# Patient Record
Sex: Male | Born: 1960 | Hispanic: No | Marital: Single | Smoking: Current every day smoker
Health system: Southern US, Community
[De-identification: ages and names within clinical notes are randomized; demographics above are authoritative.]

## PROBLEM LIST (undated history)

## (undated) DIAGNOSIS — E119 Type 2 diabetes mellitus without complications: Secondary | ICD-10-CM

## (undated) DIAGNOSIS — I10 Essential (primary) hypertension: Secondary | ICD-10-CM

## (undated) DIAGNOSIS — M67912 Unspecified disorder of synovium and tendon, left shoulder: Secondary | ICD-10-CM

## (undated) DIAGNOSIS — J45909 Unspecified asthma, uncomplicated: Secondary | ICD-10-CM

## (undated) HISTORY — PX: TOTAL HIP ARTHROPLASTY: SHX124

## (undated) HISTORY — PX: TONSILLECTOMY AND ADENOIDECTOMY: SHX28

---

## 2008-02-22 DIAGNOSIS — T513X1A Toxic effect of fusel oil, accidental (unintentional), initial encounter: Secondary | ICD-10-CM | POA: Insufficient documentation

## 2011-08-09 HISTORY — PX: TOTAL HIP ARTHROPLASTY: SHX124

## 2013-05-01 DIAGNOSIS — M1990 Unspecified osteoarthritis, unspecified site: Secondary | ICD-10-CM | POA: Insufficient documentation

## 2016-04-26 DIAGNOSIS — G8929 Other chronic pain: Secondary | ICD-10-CM | POA: Insufficient documentation

## 2018-03-28 ENCOUNTER — Emergency Department (HOSPITAL_COMMUNITY): Payer: Self-pay

## 2018-03-28 ENCOUNTER — Other Ambulatory Visit: Payer: Self-pay

## 2018-03-28 ENCOUNTER — Encounter (HOSPITAL_COMMUNITY): Payer: Self-pay | Admitting: *Deleted

## 2018-03-28 ENCOUNTER — Emergency Department (HOSPITAL_COMMUNITY)
Admission: EM | Admit: 2018-03-28 | Discharge: 2018-03-28 | Disposition: A | Discharge status: Home / Self Care | Payer: Self-pay | Attending: Emergency Medicine | Admitting: Emergency Medicine

## 2018-03-28 DIAGNOSIS — Z96642 Presence of left artificial hip joint: Secondary | ICD-10-CM | POA: Insufficient documentation

## 2018-03-28 DIAGNOSIS — E119 Type 2 diabetes mellitus without complications: Secondary | ICD-10-CM | POA: Insufficient documentation

## 2018-03-28 DIAGNOSIS — Z79899 Other long term (current) drug therapy: Secondary | ICD-10-CM | POA: Insufficient documentation

## 2018-03-28 DIAGNOSIS — M79605 Pain in left leg: Secondary | ICD-10-CM | POA: Insufficient documentation

## 2018-03-28 DIAGNOSIS — Z794 Long term (current) use of insulin: Secondary | ICD-10-CM | POA: Insufficient documentation

## 2018-03-28 DIAGNOSIS — F17210 Nicotine dependence, cigarettes, uncomplicated: Secondary | ICD-10-CM | POA: Insufficient documentation

## 2018-03-28 DIAGNOSIS — I10 Essential (primary) hypertension: Secondary | ICD-10-CM | POA: Insufficient documentation

## 2018-03-28 HISTORY — DX: Unspecified disorder of synovium and tendon, left shoulder: M67.912

## 2018-03-28 HISTORY — DX: Type 2 diabetes mellitus without complications: E11.9

## 2018-03-28 HISTORY — DX: Essential (primary) hypertension: I10

## 2018-03-28 HISTORY — DX: Unspecified asthma, uncomplicated: J45.909

## 2018-03-28 NOTE — ED Provider Notes (Signed)
[facility] EMERGENCY DEPARTMENT Provider Note   CSN: [id number] Arrival date & time: 03/28/18  [id number]     History   Chief Complaint Chief Complaint  Patient presents with  . Leg Pain    HPI Dustin Moore is a 57 y.o. male.  Patient new to the area.  He has had pain kind of left lateral calf area radiates sometimes down to his foot sometimes up to his thigh this been ongoing for 3 days.  No history of injury.  [place].  He uses BenGay and has tried ibuprofen without relief.  Patient does have a history of diabetes.  But not known to have diabetic neuropathy.  Patient denies any back pain.     Past Medical History:  Diagnosis Date  . Asthma   . Diabetes mellitus without complication (HCC)   . Hypertension   . Rotator cuff disorder, left     There are no active problems to display for this patient.   Past Surgical History:  Procedure Laterality Date  . TONSILLECTOMY AND ADENOIDECTOMY    . TOTAL HIP ARTHROPLASTY Left         Home Medications    Prior to Admission medications   Medication Sig Start Date End Date Taking? Authorizing Provider  albuterol (PROVENTIL HFA) 108 (90 Base) MCG/ACT inhaler INHALE 2 PUFFS INTO THE LUNGS EVERY 4 HOURS AS NEEDED FOR SHORTNESS OF BREATH OR WHEEZING. 08/24/17  Yes [provider]  budesonide-formoterol (SYMBICORT) 160-4.5 MCG/ACT inhaler INHALE 2 PUFFS INTO LUNGS 2 TIMES DAILY 03/05/18  Yes [provider]  Insulin Glargine (LANTUS SOLOSTAR) 100 UNIT/ML Solostar Pen Inject into the skin. 01/30/17  Yes [provider]  insulin lispro (HUMALOG KWIKPEN) 100 UNIT/ML KiwkPen INJECT 10 UNITS INTO THE SKIN 3 TIMES DAILY BEFORE MEALS. 12/21/17  Yes [provider]  losartan-hydrochlorothiazide (HYZAAR) 50-12.5 MG tablet Take by mouth. 09/27/17  Yes [provider]    Family History No family history on file.  Social History Social History    Tobacco Use  . Smoking status: Current Every Day Smoker    Packs/day: 0.50    Types: Cigarettes  Substance Use Topics  . Alcohol use: Yes    Comment: occasinally on weekends   . Drug use: Never     Allergies   Contrast media [iodinated diagnostic agents]   Review of Systems Review of Systems  Constitutional: Negative for fever.  HENT: Negative for congestion.   Eyes: Negative for redness.  Respiratory: Positive for shortness of breath.   Gastrointestinal: Negative for abdominal pain.  Genitourinary: Negative for dysuria.  Musculoskeletal: Negative for back pain and joint swelling.  Skin: Negative for wound.  Neurological: Negative for headaches.  Hematological: Does not bruise/bleed easily.  Psychiatric/Behavioral: Negative for confusion.     Physical Exam Updated Vital Signs BP (!) 142/85 (BP Location: Right Arm)   Pulse 71   Temp 98.2 F (36.8 C) (Oral)   Resp 16   Ht 1.791 m (5' 10.5")   Wt 99.8 kg   SpO2 97%   BMI 31.12 kg/m   Physical Exam  Constitutional: He is oriented to person, place, and time. He appears well-developed and well-nourished. No distress.  HENT:  Head: Normocephalic and atraumatic.  Mouth/Throat: Oropharynx is clear and moist.  Eyes: Pupils are equal, round, and reactive to light. Conjunctivae and EOM are normal.  Neck: Normal range of motion. Neck supple.  Cardiovascular: Normal rate, regular  rhythm and normal heart sounds.  Pulmonary/Chest: Effort normal and breath sounds normal. No respiratory distress.  Abdominal: Soft. Bowel sounds are normal. There is no tenderness.  Musculoskeletal: Normal range of motion. He exhibits no edema, tenderness or deformity.  Subjectively pain to the lateral aspect of the left leg.  No swelling no deformity no erythema no tenderness to palpation.  Knee joint ankle joint all normal.  Good cap refill to the left foot sensation intact.  Dorsalis pedis pulse was 1+.  Neurological: He is alert and  oriented to person, place, and time. No cranial nerve deficit or sensory deficit. He exhibits normal muscle tone. Coordination normal.  Skin: Skin is warm.  Nursing note and vitals reviewed.    ED Treatments / Results  Labs (all labs ordered are listed, but only abnormal results are displayed) Labs Reviewed - No data to display  EKG None  Radiology Dg Tibia/fibula Left  Result Date: 03/28/2018 CLINICAL DATA:  Left calf pain EXAM: LEFT TIBIA AND FIBULA - 2 VIEW COMPARISON:  None. FINDINGS: There is no evidence of fracture or other focal bone lesions. Soft tissues are unremarkable. IMPRESSION: Negative. Electronically Signed   By: Charlett NoseKevin  Dover M.D.   On: 03/28/2018 08:55   [place] Venous Img Lower  Left (dvt Study)  Result Date: 03/28/2018 CLINICAL DATA:  Left calf pain. EXAM: LEFT LOWER EXTREMITY VENOUS DOPPLER ULTRASOUND TECHNIQUE: Gray-scale sonography with graded compression, as well as color Doppler and duplex ultrasound were performed to evaluate the lower extremity deep venous systems from the level of the common femoral vein and including the common femoral, femoral, profunda femoral, popliteal and calf veins including the posterior tibial, peroneal and gastrocnemius veins when visible. The superficial great saphenous vein was also interrogated. Spectral Doppler was utilized to evaluate flow at rest and with distal augmentation maneuvers in the common femoral, femoral and popliteal veins. COMPARISON:  None. FINDINGS: Contralateral Common Femoral Vein: Respiratory phasicity is normal and symmetric with the symptomatic side. No evidence of thrombus. Normal compressibility. Common Femoral Vein: No evidence of thrombus. Normal compressibility, respiratory phasicity and response to augmentation. Saphenofemoral Junction: No evidence of thrombus. Normal compressibility and flow on color Doppler imaging. Profunda Femoral Vein: No evidence of thrombus. Normal compressibility and flow on color Doppler  imaging. Femoral Vein: No evidence of thrombus. Normal compressibility, respiratory phasicity and response to augmentation. Popliteal Vein: No evidence of thrombus. Normal compressibility, respiratory phasicity and response to augmentation. Calf Veins: No evidence of thrombus. Normal compressibility and flow on color Doppler imaging. Superficial Great Saphenous Vein: No evidence of thrombus. Normal compressibility. Venous Reflux:  None. Other Findings: No evidence of superficial thrombophlebitis or abnormal fluid collection. IMPRESSION: No evidence of left lower extremity deep venous thrombosis. Electronically Signed   By: Irish LackGlenn  Yamagata M.D.   On: 03/28/2018 10:14    Procedures Procedures (including critical care time)  Medications Ordered in ED Medications - No data to display   Initial Impression / Assessment and Plan / ED Course  I have reviewed the triage vital signs and the nursing notes.  Pertinent labs & imaging results that were available during my care of the patient were reviewed by me and considered in my medical decision making (see chart for details).    Patient's work-up for the left leg pain without any significant findings.  The Doppler studies negative for deep vein thrombosis.  X-rays of the tib-fib of the left leg were negative.  No erythema no swelling pain seems to be lateral aspect  of the of the leg itself.  Knees fine ankles fine no erythema no swelling good dorsalis pedis pulse good cap refill.  No tenderness to palpation.  Patient claimed he was new to the area.  Was given referral to Dr. Mort Sawyers office for orthopedics.  Also was planning on giving a prescription for tramadol to help with the pain which is more so at rest than it is with any exertion.  So not consistent with a claudication type picture.  Patient stated that that would be good enough wanted something stronger.  Based on that also reviewed the database.  Patient had received 30 tablets of oxycodone on  August 15 from the hospital area.  And also received on August 5 another 30 tablets of cocci codon.  This prescription did note on the 15th that it was to last for 30 days.  Based on this patient not given a narcotic prescription for pain.  When confronted patient said okay then just took his discharge and left.  Not clear the cause of the patient's symptoms.  Diabetic neuropathy is possibility but an unusual pattern for that.  Again still did recommend follow-up with orthopedics.  Final Clinical Impressions(s) / ED Diagnoses   Final diagnoses:  Left leg pain    ED Discharge Orders    None       Vanetta Mulders, MD 03/28/18 1555

## 2018-03-28 NOTE — Discharge Instructions (Addendum)
Work-up for the left leg pain with negative Doppler studies for a blood clot in the leg.  X-rays of the bone of the leg without any abnormalities.  Exact cause of the leg pain is not clear.  Could be related to diabetic neuropathy could be just musculoskeletal in nature.  Recommend follow-up with orthopedics.  State database shows that you received narcotic prescription on August 15 from your physician in [place] that was due to be a month supply.  Not able to fill any narcotic pain medicines for you at this time.

## 2018-03-28 NOTE — ED Triage Notes (Signed)
Pt c/o left calf pain with radiation up to the thigh and down lower leg x 3 days. Pt has used Bengay and Ibuprofen without relief. Pt reports the pain is better when he stands up. Denies injury.

## 2018-09-16 ENCOUNTER — Emergency Department (HOSPITAL_COMMUNITY)
Admission: EM | Admit: 2018-09-16 | Discharge: 2018-09-16 | Disposition: A | Discharge status: Home / Self Care | Payer: Self-pay | Attending: Emergency Medicine | Admitting: Emergency Medicine

## 2018-09-16 ENCOUNTER — Other Ambulatory Visit: Payer: Self-pay

## 2018-09-16 ENCOUNTER — Encounter (HOSPITAL_COMMUNITY): Payer: Self-pay | Admitting: Emergency Medicine

## 2018-09-16 DIAGNOSIS — L0291 Cutaneous abscess, unspecified: Secondary | ICD-10-CM

## 2018-09-16 DIAGNOSIS — J45909 Unspecified asthma, uncomplicated: Secondary | ICD-10-CM | POA: Insufficient documentation

## 2018-09-16 DIAGNOSIS — E119 Type 2 diabetes mellitus without complications: Secondary | ICD-10-CM | POA: Insufficient documentation

## 2018-09-16 DIAGNOSIS — Z79899 Other long term (current) drug therapy: Secondary | ICD-10-CM | POA: Insufficient documentation

## 2018-09-16 DIAGNOSIS — F17210 Nicotine dependence, cigarettes, uncomplicated: Secondary | ICD-10-CM | POA: Insufficient documentation

## 2018-09-16 DIAGNOSIS — L02214 Cutaneous abscess of groin: Secondary | ICD-10-CM | POA: Insufficient documentation

## 2018-09-16 DIAGNOSIS — I10 Essential (primary) hypertension: Secondary | ICD-10-CM | POA: Insufficient documentation

## 2018-09-16 DIAGNOSIS — Z7982 Long term (current) use of aspirin: Secondary | ICD-10-CM | POA: Insufficient documentation

## 2018-09-16 DIAGNOSIS — Z794 Long term (current) use of insulin: Secondary | ICD-10-CM | POA: Insufficient documentation

## 2018-09-16 MED ORDER — LIDOCAINE HCL (PF) 1 % IJ SOLN
5.0000 mL | Freq: Once | INTRAMUSCULAR | Status: AC
  Administered 2018-09-16: 5 mL

## 2018-09-16 MED ORDER — LIDOCAINE HCL (PF) 1 % IJ SOLN
INTRAMUSCULAR | Status: AC
  Filled 2018-09-16: qty 6

## 2018-09-16 MED ORDER — SULFAMETHOXAZOLE-TRIMETHOPRIM 800-160 MG PO TABS: 1.0000 | ORAL_TABLET | Freq: Two times a day (BID) | ORAL | 0 refills | Status: AC

## 2018-09-16 NOTE — ED Notes (Signed)
ED Provider at bedside. 

## 2018-09-16 NOTE — ED Provider Notes (Signed)
[facility] EMERGENCY DEPARTMENT Provider Note   CSN: [id number] Arrival date & time: 09/16/18  0202     History   Chief Complaint Chief Complaint  Patient presents with  . Abscess    HPI Dustin Moore is a 58 y.o. male.  Patient is a 58 year old male with past medical history of diabetes and hypertension.  He presents today for evaluation of pain and swelling to the left groin.  He was seen by his doctor 2 days ago for similar complaints.  He was started on doxycycline for presumed cellulitis, however the swelling and pain has worsened.  He denies any fevers or chills.  He does inject himself in the abdomen with his insulin and believes that this may have caused the infection.  The history is provided by the patient.  Abscess  Abscess location: Left groin. Size:  5 cm x 3 cm Abscess quality: fluctuance, induration, painful and redness   Red streaking: no   Duration:  4 days Progression:  Worsening Pain details:    Quality:  Throbbing   Severity:  Moderate   Timing:  Constant   Progression:  Worsening Chronicity:  New Context: diabetes   Relieved by:  Nothing Ineffective treatments: Doxycycline. Associated symptoms: no fever     Past Medical History:  Diagnosis Date  . Asthma   . Diabetes mellitus without complication (HCC)   . Hypertension   . Rotator cuff disorder, left     There are no active problems to display for this patient.   Past Surgical History:  Procedure Laterality Date  . TONSILLECTOMY AND ADENOIDECTOMY    . TOTAL HIP ARTHROPLASTY Left         Home Medications    Prior to Admission medications   Medication Sig Start Date End Date Taking? Authorizing Provider  albuterol (PROVENTIL HFA) 108 (90 Base) MCG/ACT inhaler INHALE 2 PUFFS INTO THE LUNGS EVERY 4 HOURS AS NEEDED FOR SHORTNESS OF BREATH OR WHEEZING. 08/24/17  Yes [provider]  aspirin EC 81 MG tablet Take 81 mg by mouth daily.   Yes [provider]    budesonide-formoterol (SYMBICORT) 160-4.5 MCG/ACT inhaler INHALE 2 PUFFS INTO LUNGS 2 TIMES DAILY 03/05/18  Yes [provider]  doxycycline (VIBRAMYCIN) 100 MG capsule Take 100 mg by mouth 2 (two) times daily.   Yes [provider]  Insulin Glargine (LANTUS SOLOSTAR) 100 UNIT/ML Solostar Pen Inject 45 Units into the skin at bedtime.  01/30/17  Yes [provider]  insulin lispro (HUMALOG KWIKPEN) 100 UNIT/ML KiwkPen INJECT 10 UNITS INTO THE SKIN 3 TIMES DAILY BEFORE MEALS. 12/21/17  Yes [provider]  losartan-hydrochlorothiazide (HYZAAR) 50-12.5 MG tablet Take 1 tablet by mouth daily.  09/27/17  Yes [provider]  MAGNESIUM PO Take 1 tablet by mouth daily.   Yes [provider]    Family History No family history on file.  Social History Social History   Tobacco Use  . Smoking status: Current Every Day Smoker    Packs/day: 0.50    Types: Cigarettes  . Smokeless tobacco: Never Used  Substance Use Topics  . Alcohol use: Yes    Comment: occasinally on weekends   . Drug use: Never     Allergies   Contrast media [iodinated diagnostic agents]   Review of Systems Review of Systems  Constitutional: Negative for fever.  All other systems reviewed and are negative.    Physical Exam Updated Vital Signs BP 137/85 (BP Location: Left Arm)  Pulse 85   Temp 98.6 F (37 C) (Oral)   Resp 20   Ht 5\' 10"  (1.778 m)   Wt 93.4 kg   SpO2 96%   BMI 29.56 kg/m   Physical Exam Vitals signs and nursing note reviewed.  Constitutional:      Appearance: Normal appearance.  HENT:     Head: Normocephalic.  Pulmonary:     Effort: Pulmonary effort is normal.  Skin:    General: Skin is warm and dry.     Comments: There is a 5 cm x 3 cm area of induration in the left upper groin with erythema extending downward towards the genitalia.  There is no significant swelling or discoloration of the genitals.  There is no crepitus in the  scrotum or penis.  Neurological:     Mental Status: He is alert.      ED Treatments / Results  Labs (all labs ordered are listed, but only abnormal results are displayed) Labs Reviewed - No data to display  EKG None  Radiology No results found.  Procedures Procedures (including critical care time)  Medications Ordered in ED Medications  lidocaine (PF) (XYLOCAINE) 1 % injection 5 mL (5 mLs Infiltration Given 09/16/18 0227)     Initial Impression / Assessment and Plan / ED Course  I have reviewed the triage vital signs and the nursing notes.  Pertinent labs & imaging results that were available during my care of the patient were reviewed by me and considered in my medical decision making (see chart for details).  Patient with an abscess in his groin that was incised and drained with a significant quantity of foul-smelling, purulent material.  Patient tolerated this procedure well.  He will be discharged with continued doxycycline, added Bactrim, warm soaks, and follow-up as needed if he worsens.  INCISION AND DRAINAGE Performed by: Geoffery Lyons Consent: Verbal consent obtained. Risks and benefits: risks, benefits and alternatives were discussed Type: abscess  Body area: Left groin  Anesthesia: local infiltration  Incision was made with a scalpel.  Local anesthetic: lidocaine 1 % without epinephrine  Anesthetic total: 5 ml  Complexity: complex Blunt dissection to break up loculations  Drainage: purulent  Drainage amount: Moderate  Packing material: No packing placed  Patient tolerance: Patient tolerated the procedure well with no immediate complications.     Final Clinical Impressions(s) / ED Diagnoses   Final diagnoses:  None    ED Discharge Orders    None       Geoffery Lyons, MD 09/16/18 [phone]

## 2018-09-16 NOTE — Discharge Instructions (Addendum)
Continue doxycycline as previously prescribed and to begin taking Bactrim as prescribed this evening.  Apply warm soaks or sit in the tub several times daily for the next several days.  Return to the emergency department for worsening swelling, high fever, worsening pain, or other new and concerning symptoms.

## 2018-09-16 NOTE — ED Notes (Signed)
Family at bedside. 

## 2018-09-16 NOTE — ED Triage Notes (Signed)
Pt states he is diabetic and gives himself insulin shots in his stomach and gave one lower than usual and noticed a small area "come up". He saw his NP yesterday and was started on ABX but states that tonight the redness and swelling has moved down towards his penis.

## 2021-01-30 ENCOUNTER — Other Ambulatory Visit: Payer: Self-pay

## 2021-01-30 ENCOUNTER — Emergency Department (HOSPITAL_COMMUNITY): Payer: Self-pay

## 2021-01-30 ENCOUNTER — Emergency Department (HOSPITAL_COMMUNITY)
Admission: EM | Admit: 2021-01-30 | Discharge: 2021-01-30 | Disposition: A | Discharge status: Home / Self Care | Payer: Self-pay | Attending: Emergency Medicine | Admitting: Emergency Medicine

## 2021-01-30 ENCOUNTER — Encounter (HOSPITAL_COMMUNITY): Payer: Self-pay | Admitting: *Deleted

## 2021-01-30 DIAGNOSIS — Z96642 Presence of left artificial hip joint: Secondary | ICD-10-CM | POA: Insufficient documentation

## 2021-01-30 DIAGNOSIS — R079 Chest pain, unspecified: Secondary | ICD-10-CM | POA: Insufficient documentation

## 2021-01-30 DIAGNOSIS — I10 Essential (primary) hypertension: Secondary | ICD-10-CM | POA: Insufficient documentation

## 2021-01-30 DIAGNOSIS — Z7951 Long term (current) use of inhaled steroids: Secondary | ICD-10-CM | POA: Insufficient documentation

## 2021-01-30 DIAGNOSIS — E119 Type 2 diabetes mellitus without complications: Secondary | ICD-10-CM | POA: Insufficient documentation

## 2021-01-30 DIAGNOSIS — R0602 Shortness of breath: Secondary | ICD-10-CM | POA: Insufficient documentation

## 2021-01-30 DIAGNOSIS — Z79899 Other long term (current) drug therapy: Secondary | ICD-10-CM | POA: Insufficient documentation

## 2021-01-30 DIAGNOSIS — R109 Unspecified abdominal pain: Secondary | ICD-10-CM | POA: Insufficient documentation

## 2021-01-30 DIAGNOSIS — J45909 Unspecified asthma, uncomplicated: Secondary | ICD-10-CM | POA: Insufficient documentation

## 2021-01-30 DIAGNOSIS — W312XXA Contact with powered woodworking and forming machines, initial encounter: Secondary | ICD-10-CM | POA: Insufficient documentation

## 2021-01-30 DIAGNOSIS — S61211A Laceration without foreign body of left index finger without damage to nail, initial encounter: Secondary | ICD-10-CM | POA: Insufficient documentation

## 2021-01-30 DIAGNOSIS — F17210 Nicotine dependence, cigarettes, uncomplicated: Secondary | ICD-10-CM | POA: Insufficient documentation

## 2021-01-30 DIAGNOSIS — Z794 Long term (current) use of insulin: Secondary | ICD-10-CM | POA: Insufficient documentation

## 2021-01-30 DIAGNOSIS — Z7982 Long term (current) use of aspirin: Secondary | ICD-10-CM | POA: Insufficient documentation

## 2021-01-30 MED ORDER — CEPHALEXIN 500 MG PO CAPS: 500.0000 mg | ORAL_CAPSULE | Freq: Four times a day (QID) | ORAL | 0 refills | Status: AC

## 2021-01-30 MED ORDER — STERILE WATER FOR INJECTION IJ SOLN
INTRAMUSCULAR | Status: AC
  Administered 2021-01-30: 2.5 mL
  Filled 2021-01-30: qty 10

## 2021-01-30 MED ORDER — CEFAZOLIN SODIUM 1 G IJ SOLR
1.0000 g | Freq: Once | INTRAMUSCULAR | Status: AC
  Administered 2021-01-30: 1 g via INTRAMUSCULAR
  Filled 2021-01-30: qty 10

## 2021-01-30 MED ORDER — LIDOCAINE HCL (PF) 1 % IJ SOLN
5.0000 mL | Freq: Once | INTRAMUSCULAR | Status: AC
  Administered 2021-01-30: 5 mL
  Filled 2021-01-30: qty 30

## 2021-01-30 NOTE — Discharge Instructions (Addendum)
You have received 8 sutures in your finger, please keep them dry for the first 24 hours.  After that I would like you to change the dressings and rinse out the wound 2 times daily for the next 7 days.  Starting you on antibiotics please take as prescribed.  Please keep the splint on, you may take off to rinse it off.  Please follow-up with hand surgery for further evaluation.  Come back to the emergency department if you develop chest pain, shortness of breath, severe abdominal pain, uncontrolled nausea, vomiting, diarrhea.

## 2021-01-30 NOTE — ED Triage Notes (Signed)
Laceration to left index finger , cut with a saw

## 2021-01-30 NOTE — ED Provider Notes (Signed)
[facility]ENCY [facility]PART[facility] Provider Note   CSN: [id number] Arrival date & time: 01/30/21  1548     History Chief Complaint  Patient presents with   Extremity Laceration    Dustin Moore is a 60 y.o. male.  HPI  Patient with significant medical history of asthma, diabetes, hypertension, presents with chief complaint of laceration to his left index finger.  Patient states today he was using a saw and cut the distal end of his finger, states he had pain in that finger, was able to control the bleeding with direct pressure.  States it feels numb, but is able to move at all joints of his index finger.  He is up-to-date on his tetanus shot, is not on  immunosuppressants medications.  He has no other complaints this time.  Patient does endorse chest pain, shortness breath, abdominal pain.  Past Medical History:  Diagnosis Date   Asthma    Diabetes mellitus without complication (HCC)    Hypertension    Rotator cuff disorder, left     There are no problems to display for this patient.   Past Surgical History:  Procedure Laterality Date   TONSILLECTOMY AND ADENOIDECTOMY     TOTAL HIP ARTHROPLASTY Left        No family history on file.  Social History   Tobacco Use   Smoking status: Every Day    Packs/day: 0.50    Pack years: 0.00    Types: Cigarettes   Smokeless tobacco: Never  Substance Use Topics   Alcohol use: Yes    Comment: occasinally on weekends    Drug use: Never    Home Medications Prior to Admission medications   Medication Sig Start Date End Date Taking? Authorizing Provider  cephALEXin (KEFLEX) 500 MG capsule Take 1 capsule (500 mg total) by mouth 4 (four) times daily for 7 days. 01/30/21 02/06/21 Yes Carroll SageFaulkner, Ena Demary J, PA-C  albuterol (PROVENTIL HFA) 108 (90 Base) MCG/ACT inhaler INHALE 2 PUFFS INTO THE LUNGS EVERY 4 HOURS AS NEEDED FOR SHORTNESS OF BREATH OR WHEEZING. 08/24/17   [provider]  aspirin EC 81 MG tablet Take 81 mg by mouth  daily.    [provider]  budesonide-formoterol (SYMBICORT) 160-4.5 MCG/ACT inhaler INHALE 2 PUFFS INTO LUNGS 2 TIMES DAILY 03/05/18   [provider]  doxycycline (VIBRAMYCIN) 100 MG capsule Take 100 mg by mouth 2 (two) times daily.    [provider]  Insulin Glargine (LANTUS SOLOSTAR) 100 UNIT/ML Solostar Pen Inject 45 Units into the skin at bedtime.  01/30/17   [provider]  insulin lispro (HUMALOG KWIKPEN) 100 UNIT/ML KiwkPen INJECT 10 UNITS INTO THE SKIN 3 TIMES DAILY BEFORE MEALS. 12/21/17   [provider]  losartan-hydrochlorothiazide (HYZAAR) 50-12.5 MG tablet Take 1 tablet by mouth daily.  09/27/17   [provider]  MAGNESIUM PO Take 1 tablet by mouth daily.    [provider]    Allergies    Contrast media [iodinated diagnostic agents]  Review of Systems   Review of Systems  Constitutional:  Negative for chills and fever.  HENT:  Negative for congestion.   Respiratory:  Negative for shortness of breath.   Cardiovascular:  Negative for chest pain.  Gastrointestinal:  Negative for abdominal pain.  Genitourinary:  Negative for enuresis.  Musculoskeletal:  Negative for back pain.  Skin:  Positive for wound. Negative for rash.       Wound to the left index finger.  Neurological:  Negative for  dizziness.  Hematological:  Does not bruise/bleed easily.   Physical Exam Updated Vital Signs BP (!) 134/96 (BP Location: Right Arm)   Pulse 86   Temp 98 F (36.7 C) (Oral)   Resp 18   Ht 5\' 10"  (1.778 m)   Wt 92.1 kg   SpO2 97%   BMI 29.15 kg/m   Physical Exam Vitals and nursing note reviewed.  Constitutional:      General: He is not in acute distress.    Appearance: He is not ill-appearing.  HENT:     Head: Normocephalic and atraumatic.     Nose: No congestion.  Eyes:     Conjunctiva/sclera: Conjunctivae normal.  Cardiovascular:     Rate and Rhythm: Normal rate and regular rhythm.     Pulses: Normal  pulses.  Pulmonary:     Effort: Pulmonary effort is normal.  Musculoskeletal:     Comments: Patient has a noted laceration on the distal end of his left index finger, distal to the PIP joint, there is no noted foreign body present, no ligament or tendon damage noted, he has full range of motion at all joints his finger, neurovascular fully intact.  Skin:    General: Skin is warm and dry.  Neurological:     Mental Status: He is alert.  Psychiatric:        Mood and Affect: Mood normal.       ED Results / Procedures / Treatments   Labs (all labs ordered are listed, but only abnormal results are displayed) Labs Reviewed - No data to display  EKG None  Radiology DG Finger Index Left  Result Date: 01/30/2021 CLINICAL DATA:  Laceration with circular Sol. EXAM: LEFT INDEX FINGER 2+V COMPARISON:  None. FINDINGS: There appears to be a relatively subtle fracture underlying the laceration in the distal tuft of the index finger. Evaluation for foreign bodies is limited due to the overlapping gauze. No definite foreign bodies are identified. The laceration is noted. IMPRESSION: Evaluation for foreign bodies is limited due to overlying gauze but no foreign body is noted. Fracture through the tuft of the index finger underlying laceration. Electronically Signed   By: 02/01/2021 III M.D   On: 01/30/2021 17:17    Procedures .06/27/2022Laceration Repair  Date/Time: 01/30/2021 6:14 PM Performed by: 02/01/2021, PA-C Authorized by: Carroll Sage, PA-C   Consent:    Consent obtained:  Verbal   Consent given by:  Patient   Risks discussed:  Infection, pain, retained foreign body, need for additional repair, poor cosmetic result, tendon damage, vascular damage, poor wound healing and nerve damage   Alternatives discussed:  No treatment Universal protocol:    Patient identity confirmed:  Verbally with patient Anesthesia:    Anesthesia method:  Nerve block   Block needle gauge:  24 G    Block anesthetic:  Lidocaine 1% w/o epi   Block outcome:  Anesthesia achieved Laceration details:    Location:  Finger   Finger location:  L index finger   Length (cm):  6   Depth (mm):  2 Pre-procedure details:    Preparation:  Patient was prepped and draped in usual sterile fashion and imaging obtained to evaluate for foreign bodies Exploration:    Hemostasis achieved with:  Direct pressure   Imaging obtained: x-ray     Imaging outcome: foreign body not noted     Wound exploration: wound explored through full range of motion and entire depth of wound visualized  Contaminated: no   Treatment:    Area cleansed with:  Saline   Amount of cleaning:  Extensive   Irrigation solution:  Sterile saline   Irrigation method:  Syringe   Visualized foreign bodies/material removed: no     Debridement:  None Skin repair:    Repair method:  Sutures   Suture size:  4-0   Suture material:  Prolene   Suture technique:  Simple interrupted   Number of sutures:  8 Approximation:    Approximation:  Close Repair type:    Repair type:  Simple Post-procedure details:    Dressing:  Non-adherent dressing and splint for protection   Procedure completion:  Tolerated well, no immediate complications Comments:     After the procedure patient motor, sensation, strength were all intact. area was soft to the touch with good capillary refill.  No signs of infection were noted, no rash, no ligament or tendon damage present.   Medications Ordered in ED Medications  lidocaine (PF) (XYLOCAINE) 1 % injection 5 mL (5 mLs Infiltration Given 01/30/21 1803)  ceFAZolin (ANCEF) injection 1 g (1 g Intramuscular Given 01/30/21 1803)  sterile water (preservative free) injection (2.5 mLs  Given 01/30/21 1803)    ED Course  I have reviewed the triage vital signs and the nursing notes.  Pertinent labs & imaging results that were available during my care of the patient were reviewed by me and considered in my medical  decision making (see chart for details).    MDM Rules/Calculators/A&P                         Initial impression-patient presents with laceration to his left index finger.  He is alert, does not appear in acute stress, vital signs reassuring.  Will obtain imaging for further evaluation, will recommend sutures for improved wound healing.  Work-up-x-ray reveals no foreign body present, fracture to the tuft of the index finger.  Reassessment-Will recommend suturing to decrease infection risk and to assist with the healing process went over risks and benefits.  Patient was agreeable with this and tolerated the procedure well.  Unfortunately on the lateral aspect of the laceration there is chunk of flesh missing, unable to close with suturing, this will have to heal second tension, patient is aware of scarring process.  He received 8 sutures.  Neurovascular was fully intact after the procedure  Rule out-Low suspicion for fracture or dislocation as x-ray does not reveal any acute findings. low suspicion for ligament or tendon damage as area was palpated no gross defects noted, he had full range of motion in his left index finger.  Low suspicion for compartment syndrome as area was palpated it was soft to the touch, neurovascular fully intact before and after the procedure  Plan-  Laceration of the left index finger-patient received 8 sutures, received IV Ancef in the emergency department, will go home on Keflex, a hard splint due to tuft fracture follow-up with hand surgery for further evaluation.  Vital signs have remained stable, no indication for hospital admission.Patient given at home care as well strict return precautions.  Patient verbalized that they understood agreed to said plan.  Final Clinical Impression(s) / ED Diagnoses Final diagnoses:  Laceration of left index finger without foreign body without damage to nail, initial encounter    Rx / DC Orders ED Discharge Orders           Ordered    cephALEXin (KEFLEX) 500 MG capsule  4 times daily        01/30/21 1807             Barnie Del 01/30/21 1815    Derwood Kaplan, MD 01/31/21 [phone]

## 2021-02-22 ENCOUNTER — Telehealth: Payer: Self-pay

## 2021-02-22 NOTE — Telephone Encounter (Signed)
Client recently enrolled in [facility] and provider is [facility]. Client has been approved for [place] [facility] until 02/05/22 however Toujeo and Symbicort are not available and cost is a burden without insurance.  Financial assistance with pharmaceutical companies for both medications found and explained to client to fill out his portion then the provider fills out that portion and sends in on his behalf.  Client's significant other Elnita Maxwell will come by to pick up both applications.  Client reports understanding.  Client is also working with Abran Duke for financial assistance for [employer] as well as [facility] as client needs referral for his hand.  Francee Nodal RN [employer]

## 2021-03-31 ENCOUNTER — Institutional Professional Consult (permissible substitution): Payer: Self-pay | Admitting: Plastic Surgery

## 2021-08-08 HISTORY — PX: ROTATOR CUFF REPAIR: SHX139

## 2022-01-25 ENCOUNTER — Telehealth: Payer: Self-pay

## 2022-01-25 NOTE — Telephone Encounter (Signed)
Followed  up with pt to confirm that his medassist application was resent to [facility] on today.

## 2022-01-25 NOTE — Telephone Encounter (Signed)
Pt called to inquiry about [facility] approval confirmation  Plan  -pt was advised this information with be reviewed and they will receive a return call back on today

## 2022-02-14 ENCOUNTER — Telehealth: Payer: Self-pay

## 2022-02-14 NOTE — Telephone Encounter (Signed)
Attempted to return call, no answer, left message to call Care connect back. Francee Nodal RN [employer]

## 2022-07-06 ENCOUNTER — Telehealth: Payer: Self-pay

## 2022-07-07 ENCOUNTER — Telehealth: Payer: Self-pay

## 2022-07-07 NOTE — Telephone Encounter (Signed)
Attempted to make 2nd follow up call with patient after receiving message from Diane B. r/t to patients request to switch medical providers on 11.29.23  Plan pt continues to still not available at time of call, but did leave another voice mail message and sent a 2nd text message immediately.

## 2022-07-07 NOTE — Telephone Encounter (Signed)
Attempted to make 1st follow up call with patient after receiving message from Diane B. Of [facility] r/t to patients request to switch medical providers to [facility] pt was not available at time of call, but did leave a voice mail message and sent a text message immediately following call  -pt insurance/medicaid status was verified as "ineligible"for date of service 07/08/2022 to 08/07/2022 which continues to make him eligible for care connect uninsured program.  -will attempt to contact patient for a 2nd time on 11.30.23 if he does not return call first

## 2022-07-19 ENCOUNTER — Telehealth: Payer: Self-pay

## 2022-07-19 NOTE — Telephone Encounter (Signed)
Called to follow up with DM client that has been going to [facility]. His Last in person appointment there was 03/22/22. His A!C last on 03/07/22 was up to 11.5, from 10/19/21 of 9.6 and 07/20/21 8.7.  He states he has his medications for diabetes, He reports taking Lispro SS as needed, basaglar insulin each evening, Metformin and states he is also taking Trulicity once weekly. He states he has a new accucheck meter and strips, he reports blood sugar this morning 169.   Client had requested to change providers from [facility] to [facility], we had been trying to reach client since 07/06/22. He states he is having issues with his phone. He has a new one he plans on setting up. Scheduled client an appointment to establish medical care at [facility] for 07/26/22 at 1PM. Address and phone number provided to his girlfriend Elnita Maxwell and she wrote it down. Discussed copayment would be 10$. Instructed client to bring all of his current medications with him to his appointment including any supplements or vitamins. Also to take his glucose meter in case the provider would like to look at his readings. He states he has had 2 Covid vaccines and a booster, instructed to bring his card with him to his appointment.  Will plan follow up after his visit by phone. I did also discuss Medicaid expansion and the ways to apply for Medicaid. Also informed client if he does get approved for Medicaid, he would need to change providers again, he wishes to go ahead and [facility] care [facility] at this time.    Francee Nodal RN [employer]

## 2022-07-26 ENCOUNTER — Ambulatory Visit: Payer: Self-pay | Admitting: Physician Assistant

## 2022-07-27 ENCOUNTER — Ambulatory Visit: Payer: Self-pay | Admitting: Physician Assistant

## 2022-08-11 ENCOUNTER — Ambulatory Visit: Payer: Self-pay | Admitting: Physician Assistant

## 2022-08-11 ENCOUNTER — Encounter: Payer: Self-pay | Admitting: Physician Assistant

## 2022-08-11 ENCOUNTER — Other Ambulatory Visit: Payer: Self-pay | Admitting: Physician Assistant

## 2022-08-11 VITALS — BP 130/80 | HR 83 | Temp 96.9°F | Ht 67.25 in | Wt 216.2 lb

## 2022-08-11 DIAGNOSIS — Z1211 Encounter for screening for malignant neoplasm of colon: Secondary | ICD-10-CM

## 2022-08-11 DIAGNOSIS — F17200 Nicotine dependence, unspecified, uncomplicated: Secondary | ICD-10-CM

## 2022-08-11 DIAGNOSIS — E1165 Type 2 diabetes mellitus with hyperglycemia: Secondary | ICD-10-CM | POA: Insufficient documentation

## 2022-08-11 DIAGNOSIS — Z125 Encounter for screening for malignant neoplasm of prostate: Secondary | ICD-10-CM

## 2022-08-11 DIAGNOSIS — E785 Hyperlipidemia, unspecified: Secondary | ICD-10-CM | POA: Insufficient documentation

## 2022-08-11 DIAGNOSIS — Z7689 Persons encountering health services in other specified circumstances: Secondary | ICD-10-CM

## 2022-08-11 DIAGNOSIS — E1169 Type 2 diabetes mellitus with other specified complication: Secondary | ICD-10-CM

## 2022-08-11 DIAGNOSIS — E1140 Type 2 diabetes mellitus with diabetic neuropathy, unspecified: Secondary | ICD-10-CM

## 2022-08-11 DIAGNOSIS — I10 Essential (primary) hypertension: Secondary | ICD-10-CM | POA: Insufficient documentation

## 2022-08-11 DIAGNOSIS — D649 Anemia, unspecified: Secondary | ICD-10-CM

## 2022-08-11 NOTE — Progress Notes (Signed)
BP 130/80   Pulse 83   Temp (!) 96.9 F (36.1 C)   Ht 5' 7.25" (1.708 m)   Wt 216 lb 4 oz (98.1 kg)   SpO2 96%   BMI 33.62 kg/m    Subjective:    Patient ID: Dustin Moore, male    DOB: 1961-05-21, 62 y.o.   MRN: [id number]  HPI: Dustin Moore is a 62 y.o. male presenting on 08/11/2022 for New Patient (Initial Visit) (Previous pt at [facility] is switching to [facility] as he lives in [place].)   HPI   Chief Complaint  Patient presents with   New Patient (Initial Visit)    Previous pt at [facility] pt is switching to [facility] as he lives in [place].     Pt is 77yoM who presents to establish care.  He says he was diagnosed with  DM about age 25  He says he started Using humalog 10u before meals about 5 weeks ago and he says that is working well with controlling his DM.  This morning fbs was 102.  He doesn't need his albuterol very often, mostly during allergy season.Dustin Moore  He still smokes 1 or 2  cigarettes daily.  He used to smoke 1-2 ppd.   He is active with chopping wood and similar.  He does not participate in formal exercise.    He is between jobs.  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     He has been living in [place] for 5 years.   He lived in [place] prior to that.   He gets his meds from [facility] dates on his bottles are 07/22/22.   He says he has Never had colonoscopy and doesn't want one.  He denies anxiety and depression.  He says he takes Cymbalta is for DM neuropathy.    He isn't taking the trazodone because it made him sleep for 2 days.    He says when he takes his hctz in the evening, he is up to use the bathroom during the night.   His last DM eye exam was 05/06/21 and did not show any retinopathy.     Relevant past medical, surgical, family and social history reviewed and updated as indicated. Interim medical history since our last visit reviewed. Allergies and medications reviewed and updated.    Current Outpatient  Medications:    albuterol (PROVENTIL HFA) 108 (90 Base) MCG/ACT inhaler, INHALE 2 PUFFS INTO THE LUNGS EVERY 4 HOURS AS NEEDED FOR SHORTNESS OF BREATH OR WHEEZING., Disp: , Rfl:    aspirin EC 81 MG tablet, Take 81 mg by mouth daily., Disp: , Rfl:    B Complex-C (B-COMPLEX WITH VITAMIN C) tablet, Take 1 tablet by mouth daily., Disp: , Rfl:    budesonide-formoterol (SYMBICORT) 160-4.5 MCG/ACT inhaler, Inhale 2 puffs into the lungs in the morning and at bedtime., Disp: , Rfl:    Dulaglutide (TRULICITY) 7.40 CX/4.4YJ SOPN, Inject 0.75 mg into the skin once a week., Disp: , Rfl:    DULoxetine (CYMBALTA) 20 MG capsule, Take 40 mg by mouth daily., Disp: , Rfl:    ergocalciferol (VITAMIN D2) 1.25 MG (50000 UT) capsule, Take 50,000 Units by mouth once a week., Disp: , Rfl:    hydrochlorothiazide (HYDRODIURIL) 25 MG tablet, Take 12.5 mg by mouth daily., Disp: , Rfl:    Insulin Glargine (BASAGLAR KWIKPEN [place]), Inject 50 Units into the skin at bedtime., Disp: , Rfl:    insulin lispro (HUMALOG  KWIKPEN) 100 UNIT/ML KiwkPen, INJECT 10 UNITS INTO THE SKIN 3 TIMES DAILY BEFORE MEALS., Disp: , Rfl:    losartan (COZAAR) 50 MG tablet, Take 50 mg by mouth daily., Disp: , Rfl:    MAGNESIUM PO, Take 500 mg by mouth daily., Disp: , Rfl:    metFORMIN (GLUCOPHAGE) 500 MG tablet, Take 500 mg by mouth 2 (two) times daily with a meal., Disp: , Rfl:    Omega 3-6-9 Fatty Acids (TRIPLE OMEGA COMPLEX PO), Take 1 capsule by mouth daily., Disp: , Rfl:    Potassium 99 MG TABS, Take 1 tablet by mouth daily as needed (for cramps)., Disp: , Rfl:    traZODone (DESYREL) 50 MG tablet, Take 50 mg by mouth at bedtime as needed for sleep. (Patient not taking: Reported on 08/11/2022), Disp: , Rfl:     Review of Systems  Per HPI unless specifically indicated above     Objective:    BP 130/80   Pulse 83   Temp (!) 96.9 F (36.1 C)   Ht 5' 7.25" (1.708 m)   Wt 216 lb 4 oz (98.1 kg)   SpO2 96%   BMI 33.62 kg/m   Wt Readings from  Last 3 Encounters:  08/11/22 216 lb 4 oz (98.1 kg)  01/30/21 203 lb 2 oz (92.1 kg)  09/16/18 206 lb (93.4 kg)    Physical Exam Vitals reviewed.  Constitutional:      General: He is not in acute distress.    Appearance: He is well-developed. He is obese. He is not toxic-appearing.  HENT:     Head: Normocephalic and atraumatic.     Right Ear: Tympanic membrane, ear canal and external ear normal.     Left Ear: Tympanic membrane, ear canal and external ear normal.  Eyes:     Extraocular Movements: Extraocular movements intact.     Conjunctiva/sclera: Conjunctivae normal.     Pupils: Pupils are equal, round, and reactive to light.  Neck:     Thyroid: No thyromegaly.  Cardiovascular:     Rate and Rhythm: Normal rate and regular rhythm.  Pulmonary:     Effort: Pulmonary effort is normal.     Breath sounds: Normal breath sounds. No wheezing or rales.  Abdominal:     General: Bowel sounds are normal.     Palpations: Abdomen is soft. There is no mass.     Tenderness: There is no abdominal tenderness.  Musculoskeletal:     Cervical back: Neck supple.     Right lower leg: No edema.     Left lower leg: No edema.  Lymphadenopathy:     Cervical: No cervical adenopathy.  Skin:    General: Skin is warm and dry.     Findings: No rash.  Neurological:     Mental Status: He is alert and oriented to person, place, and time.     Motor: No weakness or tremor.     Gait: Gait normal.     Deep Tendon Reflexes:     Reflex Scores:      Patellar reflexes are 2+ on the right side and 2+ on the left side. Psychiatric:        Attention and Perception: Attention normal.        Speech: Speech normal.        Behavior: Behavior normal. Behavior is cooperative.            Assessment & Plan:    Encounter Diagnoses  Name Primary?   Encounter to  establish care Yes   Type 2 diabetes mellitus with hyperglycemia, with long-term current use of insulin ([place])    Primary hypertension     Hyperlipidemia associated with type 2 diabetes mellitus ([place])    Type 2 diabetes mellitus with diabetic neuropathy, with long-term current use of insulin ([place])    Screening for colon cancer    Screening for prostate cancer    Anemia, unspecified type    Tobacco use disorder      -no medication changes today -pt is given FIT test for colon cancer screening -will Refer for annual DM eye exam -DM foot exam was updated -will Update labs  -Pt encouraged to take hctz in the morning to decrease nocturia.  Will discuss if it needs to be discontinued at next appt if he continues to have issues  -pt to follow up in 1 month.  He is to contact office sooner for any issues

## 2022-08-11 NOTE — Patient Instructions (Signed)
Get your fasting labs drawn at [facility] (main entrance)

## 2022-08-17 ENCOUNTER — Telehealth: Payer: Self-pay

## 2022-08-17 NOTE — Telephone Encounter (Signed)
No answer, called for follow up after first [facility] appointment. Left voicemail requesting return call.   [place] [employer]

## 2022-08-30 ENCOUNTER — Telehealth: Payer: Self-pay

## 2022-08-30 NOTE — Telephone Encounter (Signed)
Attempted call to follow up with client, no answer, left message requesting return call.  [place] [employer]

## 2022-09-13 ENCOUNTER — Ambulatory Visit: Payer: Self-pay | Admitting: Physician Assistant

## 2022-10-18 DIAGNOSIS — I10 Essential (primary) hypertension: Secondary | ICD-10-CM | POA: Diagnosis not present

## 2022-10-18 DIAGNOSIS — E559 Vitamin D deficiency, unspecified: Secondary | ICD-10-CM | POA: Diagnosis not present

## 2022-10-18 DIAGNOSIS — Z79899 Other long term (current) drug therapy: Secondary | ICD-10-CM | POA: Diagnosis not present

## 2022-10-18 DIAGNOSIS — E1165 Type 2 diabetes mellitus with hyperglycemia: Secondary | ICD-10-CM | POA: Diagnosis not present

## 2022-12-02 DIAGNOSIS — M7989 Other specified soft tissue disorders: Secondary | ICD-10-CM | POA: Diagnosis not present

## 2022-12-08 ENCOUNTER — Telehealth: Payer: Self-pay

## 2022-12-08 NOTE — Telephone Encounter (Signed)
Attempted call to follow up with client who has transitioned to [facility]. He has remained at [facility] for primary care.  Most recent A1C 10.7 on 10/18/22. Has been referred from PCP to [facility]. Appointment showing for 03/14/23.  No answer, left message.  Francee Nodal RN [employer]

## 2022-12-27 ENCOUNTER — Telehealth: Payer: Self-pay

## 2022-12-27 NOTE — Telephone Encounter (Addendum)
Attempted to call previous [facility] client who has transitioned to [facility] for follow up to check in. He has remained at [facility]. He is Diabetic. He has been referred by his PCP to [facility] appointment date 03/14/23.  No answer today, left voicemail requesting return call.   Francee Nodal RN [employer]

## 2023-01-25 ENCOUNTER — Telehealth: Payer: Self-pay

## 2023-01-25 NOTE — Telephone Encounter (Signed)
Previous [facility] client who has transitioned to [facility]. [facility], last seen 01/24/23. Called to today for check in to assure he knows services provided by Medicaid.  No answer, left message requesting return call.  Francee Nodal RN [employer]

## 2023-01-27 ENCOUNTER — Telehealth: Payer: Self-pay

## 2023-01-27 NOTE — Telephone Encounter (Signed)
Attempted follow up call for [facility] client that has transitioned to Medicaid. No answer and unable to leave a voicemail as mailbox is full.   Francee Nodal RN [employer]

## 2023-02-08 IMAGING — DX DG FINGER INDEX 2+V*L*
3 series · 3 of 3 positions shown · non-contrast
Comparison: None.

CLINICAL DATA: Laceration with circular Hai.

EXAM:
LEFT INDEX FINGER 2+V

[finger ap]
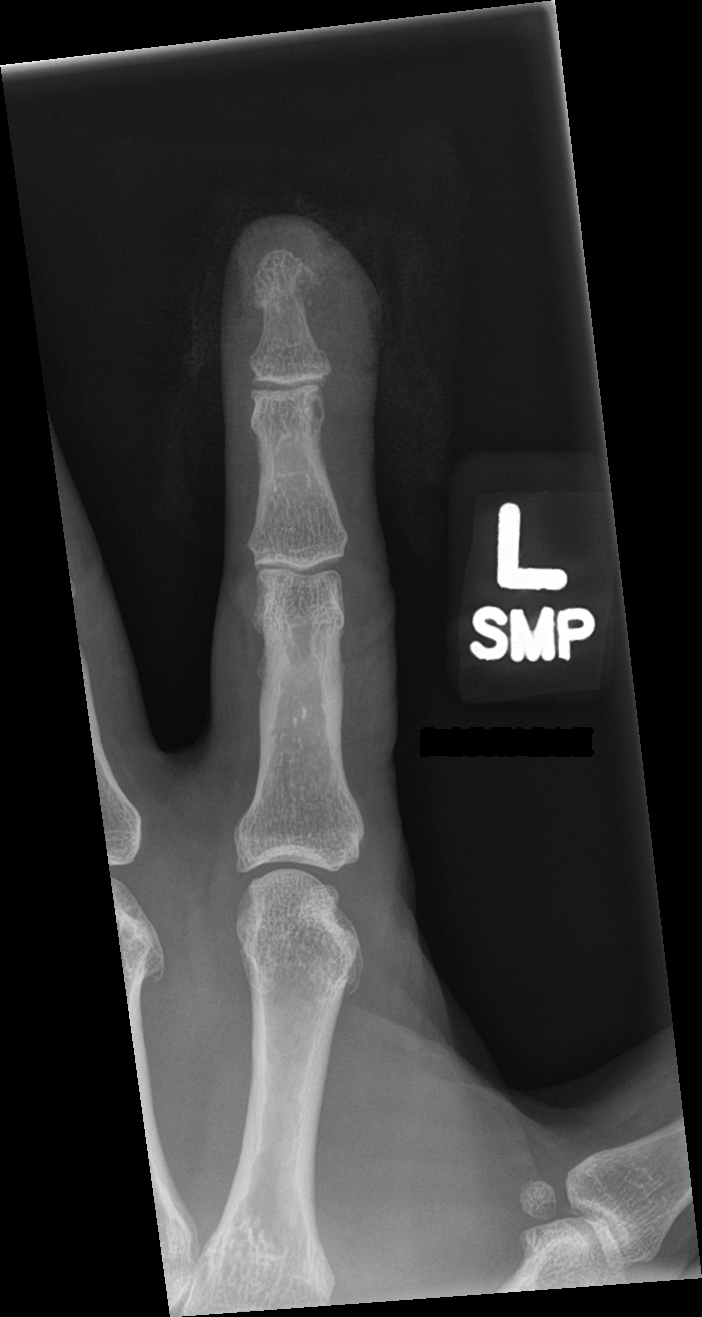

[finger obl]
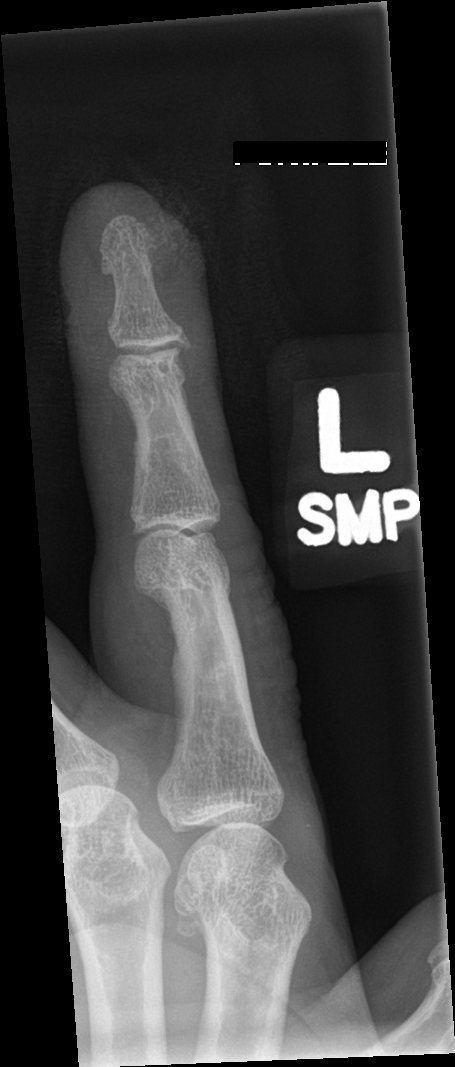

[finger lat]
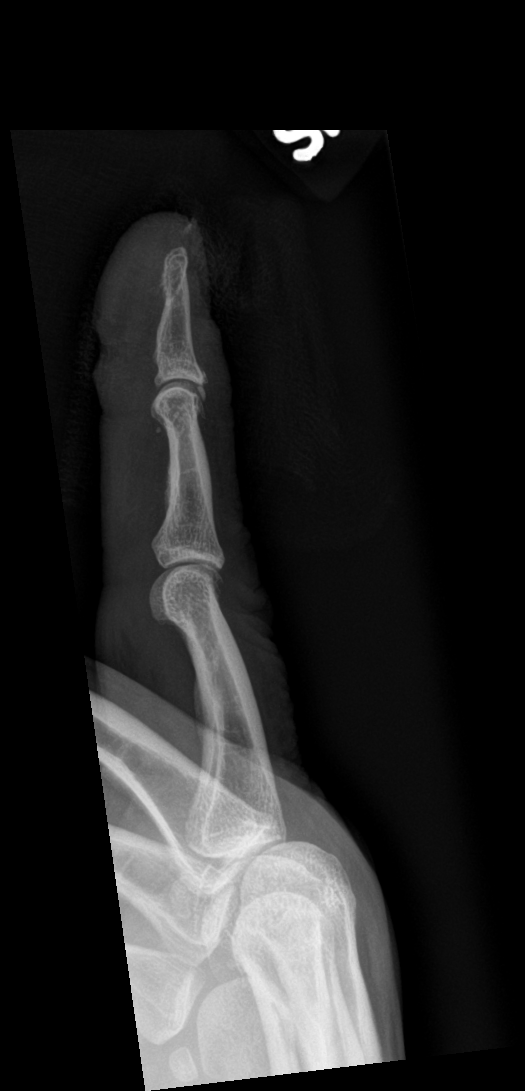

[3 of 3 positions shown; findings below may reference images not displayed]

FINDINGS: There appears to be a relatively subtle fracture underlying the
laceration in the distal tuft of the index finger. Evaluation for
foreign bodies is limited due to the overlapping gauze. No definite
foreign bodies are identified. The laceration is noted.
IMPRESSION: Evaluation for foreign bodies is limited due to overlying gauze but
no foreign body is noted.

Fracture through the tuft of the index finger underlying laceration.

## 2023-03-04 DIAGNOSIS — E1122 Type 2 diabetes mellitus with diabetic chronic kidney disease: Secondary | ICD-10-CM | POA: Diagnosis not present

## 2023-03-04 DIAGNOSIS — E669 Obesity, unspecified: Secondary | ICD-10-CM | POA: Diagnosis not present

## 2023-03-04 DIAGNOSIS — M1990 Unspecified osteoarthritis, unspecified site: Secondary | ICD-10-CM | POA: Diagnosis not present

## 2023-03-04 DIAGNOSIS — E11630 Type 2 diabetes mellitus with periodontal disease: Secondary | ICD-10-CM | POA: Diagnosis not present

## 2023-03-04 DIAGNOSIS — Z794 Long term (current) use of insulin: Secondary | ICD-10-CM | POA: Diagnosis not present

## 2023-03-04 DIAGNOSIS — F419 Anxiety disorder, unspecified: Secondary | ICD-10-CM | POA: Diagnosis not present

## 2023-03-04 DIAGNOSIS — Z8249 Family history of ischemic heart disease and other diseases of the circulatory system: Secondary | ICD-10-CM | POA: Diagnosis not present

## 2023-03-04 DIAGNOSIS — E1142 Type 2 diabetes mellitus with diabetic polyneuropathy: Secondary | ICD-10-CM | POA: Diagnosis not present

## 2023-03-04 DIAGNOSIS — Z809 Family history of malignant neoplasm, unspecified: Secondary | ICD-10-CM | POA: Diagnosis not present

## 2023-03-04 DIAGNOSIS — J45909 Unspecified asthma, uncomplicated: Secondary | ICD-10-CM | POA: Diagnosis not present

## 2023-03-04 DIAGNOSIS — Z7982 Long term (current) use of aspirin: Secondary | ICD-10-CM | POA: Diagnosis not present

## 2023-03-04 DIAGNOSIS — Z7984 Long term (current) use of oral hypoglycemic drugs: Secondary | ICD-10-CM | POA: Diagnosis not present

## 2023-03-14 ENCOUNTER — Encounter: Payer: Self-pay | Admitting: Nurse Practitioner

## 2023-03-14 ENCOUNTER — Ambulatory Visit: Payer: Medicaid Other | Admitting: Nurse Practitioner

## 2023-03-14 VITALS — BP 116/79 | HR 80 | Ht 67.25 in | Wt 225.6 lb

## 2023-03-14 DIAGNOSIS — Z7984 Long term (current) use of oral hypoglycemic drugs: Secondary | ICD-10-CM

## 2023-03-14 DIAGNOSIS — I10 Essential (primary) hypertension: Secondary | ICD-10-CM

## 2023-03-14 DIAGNOSIS — E782 Mixed hyperlipidemia: Secondary | ICD-10-CM

## 2023-03-14 DIAGNOSIS — E1165 Type 2 diabetes mellitus with hyperglycemia: Secondary | ICD-10-CM

## 2023-03-14 DIAGNOSIS — Z794 Long term (current) use of insulin: Secondary | ICD-10-CM | POA: Diagnosis not present

## 2023-03-14 DIAGNOSIS — Z7985 Long-term (current) use of injectable non-insulin antidiabetic drugs: Secondary | ICD-10-CM

## 2023-03-14 DIAGNOSIS — E559 Vitamin D deficiency, unspecified: Secondary | ICD-10-CM

## 2023-03-14 LAB — POCT GLYCOSYLATED HEMOGLOBIN (HGB A1C): Hemoglobin A1C: 9.6 % — AB (ref 4.0–5.6)

## 2023-03-14 MED ORDER — DEXCOM G7 SENSOR MISC: 1.0000 | 3 refills | Status: DC

## 2023-03-14 NOTE — Progress Notes (Signed)
Endocrinology Consult Note       03/14/2023, 5:03 PM   Subjective:    Patient ID: Dustin Moore, male    DOB: [birth year].  Dustin Moore is being seen in consultation for management of currently uncontrolled symptomatic diabetes requested by  Bucio, Julian Reil, FNP.   Past Medical History:  Diagnosis Date   Asthma    Diabetes mellitus without complication (HCC)    Hypertension    Rotator cuff disorder, left     Past Surgical History:  Procedure Laterality Date   ROTATOR CUFF REPAIR Left 08/2021   TONSILLECTOMY AND ADENOIDECTOMY     TOTAL HIP ARTHROPLASTY Left 2013    Social History   Socioeconomic History   Marital status: Single    Spouse name: Not on file   Number of children: Not on file   Years of education: Not on file   Highest education level: Not on file  Occupational History   Not on file  Tobacco Use   Smoking status: Every Day    Current packs/day: 0.25    Average packs/day: 0.3 packs/day for 39.0 years (9.8 ttl pk-yrs)    Types: Cigarettes   Smokeless tobacco: Never  Vaping Use   Vaping status: Never Used  Substance and Sexual Activity   Alcohol use: Not Currently    Comment: occasinally on weekends    Drug use: Not Currently    Types: Marijuana, Cocaine    Comment: last percocet use 01/2022 (no MJ or cocaine since he was in his 75s)   Sexual activity: Not on file  Other Topics Concern   Not on file  Social History Narrative   Not on file   Social Determinants of Health   Financial Resource Strain: Not on File (03/28/2018)   Received from [facility], [employer]    Financial Resource Strain: 0  Food Insecurity: Food Insecurity Present (03/31/2021)   Received from [facility]   Hunger Vital Sign    Worried About Running Out of Food in the Last Year: Never true    Ran Out of Food in the Last Year: Sometimes true  Transportation Needs: Not on File (03/28/2018)    Received from [facility], [employer] Needs    Transportation: 0  Physical Activity: Not on File (03/28/2018)   Received from [place], [place]   Physical Activity    Physical Activity: 0  Stress: Not on File (03/28/2018)   Received from [facility] [place], [place]   Stress    Stress: 0  Social Connections: Not on File (03/28/2018)   Received from [place], [place]   Social Connections    Social Connections and Isolation: 0    Family History  Problem Relation Age of Onset   Diabetes Mother    Cirrhosis Mother    Heart disease Father    Heart attack Father    Heart disease Paternal Uncle     Outpatient Encounter Medications as of 03/14/2023  Medication Sig   albuterol (PROVENTIL HFA) 108 (90 Base) MCG/ACT inhaler INHALE 2 PUFFS INTO THE LUNGS EVERY 4 HOURS AS NEEDED FOR SHORTNESS OF BREATH OR WHEEZING.   aspirin EC 81 MG tablet Take 81 mg by  mouth daily.   B Complex-C (B-COMPLEX WITH VITAMIN C) tablet Take 1 tablet by mouth daily.   budesonide-formoterol (SYMBICORT) 160-4.5 MCG/ACT inhaler Inhale 2 puffs into the lungs in the morning and at bedtime.   Continuous Glucose Sensor (DEXCOM G7 SENSOR) MISC Inject 1 Application into the skin as directed. Change sensor every 10 days as directed.   DULoxetine (CYMBALTA) 20 MG capsule Take 40 mg by mouth daily.   hydrochlorothiazide (HYDRODIURIL) 25 MG tablet Take 12.5 mg by mouth daily.   Insulin Glargine (BASAGLAR KWIKPEN [place]) Inject 30 Units into the skin at bedtime.   insulin lispro (HUMALOG KWIKPEN) 100 UNIT/ML KiwkPen Inject 8-14 Units into the skin 3 (three) times daily.   losartan (COZAAR) 50 MG tablet Take 50 mg by mouth daily.   MAGNESIUM PO Take 500 mg by mouth daily.   metFORMIN (GLUCOPHAGE) 500 MG tablet Take 500 mg by mouth 2 (two) times daily with a meal.   Omega 3-6-9 Fatty Acids (TRIPLE OMEGA COMPLEX PO) Take 1 capsule by mouth daily.   Potassium 99 MG TABS Take 1 tablet by mouth daily as needed (for cramps).   ergocalciferol (VITAMIN D2) 1.25  MG (50000 UT) capsule Take 50,000 Units by mouth once a week. (Patient not taking: Reported on 03/14/2023)   [DISCONTINUED] Dulaglutide (TRULICITY) 0.75 MG/0.5ML SOPN Inject 0.75 mg into the skin once a week. (Patient not taking: Reported on 03/14/2023)   No facility-administered encounter medications on file as of 03/14/2023.    ALLERGIES: Allergies  Allergen Reactions   Contrast Media [Iodinated Contrast Media] Anaphylaxis    VACCINATION STATUS: Immunization History  Administered Date(s) Administered   PFIZER(Purple Top)SARS-COV-2 Vaccination 11/14/2019, 12/06/2019    Diabetes He presents for his initial diabetic visit. He has type 2 diabetes mellitus. Onset time: diagnosed at approx age of 75. His disease course has been fluctuating. There are no hypoglycemic associated symptoms. Associated symptoms include blurred vision, fatigue, foot paresthesias, polydipsia and polyuria. There are no hypoglycemic complications. Symptoms are stable. 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 Risk factors for coronary artery disease include diabetes mellitus, dyslipidemia, family history, obesity, male sex, hypertension, sedentary lifestyle and tobacco exposure. Current diabetic treatment includes insulin injections and oral agent (monotherapy). He is compliant with treatment some of the time (only takes Humalog if glucose is high). His weight is fluctuating minimally. He is following a generally unhealthy diet. When asked about meal planning, he reported none. He has not had a previous visit with a dietitian. He participates in exercise intermittently. His overall blood glucose range is >200 mg/dl. (He presents today for his consultation, accompanied by his wife, with his meter showing gross hyperglycemia overall.  His POCT A1c today was 9.6%, improving slightly from last A1c of 10.7%.  He only monitors glucose once per day on average.  He drinks diet green tea, coffee with creamer, SF  juices, and water (with SF additive), eats 1 meal per day and occasionally snacks on fruit.  He remains active with his job in [employer], recently lost his job.  He is due for eye exam, has never been seen by podiatry in the past.  Patient has been on Trulicity in the past, then transitioned to Ozempic and slowly increased to maintenance dose of 0.5 mg weekly when he developed GI issues, still affecting him today.  He notes he does not have any appetite.  Analysis of his meter shows 7-day average of 244 (5 readings), 14-day average of 275 (7 readings), 30-day average of 275 (  21 readings), 90-day average of 268 (75 readings).  He denies any recent hypoglycemia.) An ACE inhibitor/angiotensin II receptor blocker is being taken. He does not see a podiatrist.Eye exam is not current.     Review of systems  Constitutional: + Minimally fluctuating body weight, current Body mass index is 35.07 kg/m., no fatigue, no subjective hyperthermia, no subjective hypothermia, poor dental health (needing oral surgery to removed affected teeth and get dentures) Eyes: no blurry vision, no xerophthalmia ENT: no sore throat, no nodules palpated in throat, no dysphagia/odynophagia, no hoarseness Cardiovascular: no chest pain, no shortness of breath, no palpitations, no leg swelling Respiratory: no cough, no shortness of breath Gastrointestinal: no nausea/vomiting/diarrhea, + decreased appetite Musculoskeletal: no muscle/joint aches Skin: no rashes, no hyperemia Neurological: no tremors, no numbness, no tingling, no dizziness Psychiatric: no depression, no anxiety  Objective:     BP 116/79 (BP Location: Left Arm, Patient Position: Sitting, Cuff Size: Large)   Pulse 80   Ht 5' 7.25" (1.708 m)   Wt 225 lb 9.6 oz (102.3 kg)   BMI 35.07 kg/m   Wt Readings from Last 3 Encounters:  03/14/23 225 lb 9.6 oz (102.3 kg)  08/11/22 216 lb 4 oz (98.1 kg)  01/30/21 203 lb 2 oz (92.1 kg)     BP Readings from Last 3  Encounters:  03/14/23 116/79  08/11/22 130/80  01/30/21 (!) 134/96     Physical Exam- Limited  Constitutional:  Body mass index is 35.07 kg/m. , not in acute distress, normal state of mind Eyes:  EOMI, no exophthalmos Neck: Supple Cardiovascular: RRR, no murmurs, rubs, or gallops, no edema Respiratory: Adequate breathing efforts, no crackles, rales, rhonchi, or wheezing Musculoskeletal: no gross deformities, strength intact in all four extremities, no gross restriction of joint movements Skin:  no rashes, no hyperemia Neurological: no tremor with outstretched hands  Diabetic Foot Exam - Simple   No data filed      CMP ( most recent) CMP  No results found for: "NA", "K", "CL", "CO2", "GLUCOSE", "BUN", "CREATININE", "CALCIUM", "PROT", "ALBUMIN", "AST", "ALT", "ALKPHOS", "BILITOT", "GFR", "EGFR", "GFRNONAA"   Diabetic Labs (most recent): Lab Results  Component Value Date   HGBA1C 9.6 (A) 03/14/2023     Lipid Panel ( most recent) Lipid Panel  No results found for: "CHOL", "TRIG", "HDL", "CHOLHDL", "VLDL", "LDLCALC", "LDLDIRECT", "LABVLDL"    No results found for: "TSH", "FREET4"         Assessment & Plan:   1) Type 2 diabetes mellitus with hyperglycemia, with long-term current use of insulin (HCC)  He presents today for his consultation, accompanied by his wife, with his meter showing gross hyperglycemia overall.  His POCT A1c today was 9.6%, improving slightly from last A1c of 10.7%.  He only monitors glucose once per day on average.  He drinks diet green tea, coffee with creamer, SF juices, and water (with SF additive), eats 1 meal per day and occasionally snacks on fruit.  He remains active with his job in [employer], recently lost his job.  He is due for eye exam, has never been seen by podiatry in the past.  Patient has been on Trulicity in the past, then transitioned to Ozempic and slowly increased to maintenance dose of 0.5 mg weekly when he developed GI issues,  still affecting him today.  He notes he does not have any appetite.  Analysis of his meter shows 7-day average of 244 (5 readings), 14-day average of 275 (7 readings), 30-day average of 275 (  21 readings), 90-day average of 268 (75 readings).  He denies any recent hypoglycemia.  - Dustin Moore has currently uncontrolled symptomatic type 2 DM since 62 years of age, with most recent A1c of 9.6%.   -Recent labs reviewed.  - I had a long discussion with him about the progressive nature of diabetes and the pathology behind its complications. -his diabetes is complicated by neuropathy and mild CKD and he remains at a high risk for more acute and chronic complications which include CAD, CVA, CKD, retinopathy, and neuropathy. These are all discussed in detail with him.  The following Lifestyle Medicine recommendations according to American College of Lifestyle Medicine [facility]) were discussed and offered to patient and he agrees to start the journey:  A. Whole Foods, Plant-based plate comprising of fruits and vegetables, plant-based proteins, whole-grain carbohydrates was discussed in detail with the patient.   A list for source of those nutrients were also provided to the patient.  Patient will use only water or unsweetened tea for hydration. B.  The need to stay away from risky substances including alcohol, smoking; obtaining 7 to 9 hours of restorative sleep, at least 150 minutes of moderate intensity exercise weekly, the importance of healthy social connections,  and stress reduction techniques were discussed. C.  A full color page of  Calorie density of various food groups per pound showing examples of each food groups was provided to the patient.  - I have counseled him on diet and weight management by adopting a carbohydrate restricted/protein rich diet. Patient is encouraged to switch to unprocessed or minimally processed complex starch and increased protein intake (animal or plant source), fruits, and  vegetables. -  he is advised to stick to a routine mealtimes to eat 3 meals a day and avoid unnecessary snacks (to snack only to correct hypoglycemia).   - he acknowledges that there is a room for improvement in his food and drink choices. - Suggestion is made for him to avoid simple carbohydrates from his diet including Cakes, Sweet Desserts, Ice Cream, Soda (diet and regular), Sweet Tea, Candies, Chips, Cookies, Store Bought Juices, Alcohol in Excess of 1-2 drinks a day, Artificial Sweeteners, Coffee Creamer, and "Sugar-free" Products. This will help patient to have more stable blood glucose profile and potentially avoid unintended weight gain.  - he will be scheduled with Norm Salt, RDN, CDE for diabetes education.  - I have approached him with the following individualized plan to manage his diabetes and patient agrees:   -He is advised to adjust his Basaglar to 30 units SQ nightly and adjust his Humalog to 8-14 units TID with meals if glucose is above 90 and he is eating (Specific instructions on how to titrate insulin dosage based on glucose readings given to patient in writing). He demonstrated his ability to properly use the SSI chart to dose meal time insulin with me today.  We discussed the need to consistently take his insulin so optimal control of diabetes can be obtained so he will be eligible for oral surgery.  -he is encouraged to start monitoring glucose 4 times daily, before meals and before bed, to log their readings on the clinic sheets provided, and bring them to review at follow up appointment in 3  months.  He could benefit from CGM device.  I sent in order for Dexcom G7 to his local pharmacy and gave him 1 sample sensor to get him started.  Given his MDI, he definitely needs such a device.  -  he is warned not to take insulin without proper monitoring per orders. - Adjustment parameters are given to him for hypo and hyperglycemia in writing. - he is encouraged to call clinic  for blood glucose levels less than 70 or above 300 mg /dl. - he is advised to lower Metformin 500 mg po twice daily (not TID), therapeutically suitable for patient .  -He did not tolerate GLP1 in the past (he is high risk for development of pancreatitis on such medication given heavy smoking history).  - Specific targets for  A1c; LDL, HDL, and Triglycerides were discussed with the patient.  2) Blood Pressure /Hypertension:  his blood pressure is controlled to target.   he is advised to continue his current medications including Losartan 50 mg po daily and hydrochlorothiazide 12.5 mg p.o. daily with breakfast.  3) Lipids/Hyperlipidemia:    Review of his recent lipid panel from 10/18/22 showed controlled LDL at 86 and slightly elevated triglycerides of 167. He does not appear to be on any statin medications.  4)  Weight/Diet:  his Body mass index is 35.07 kg/m.  -  clearly complicating his diabetes care.   he is a candidate for weight loss. I discussed with him the fact that loss of 5 - 10% of his  current body weight will have the most impact on his diabetes management.  Exercise, and detailed carbohydrates information provided  -  detailed on discharge instructions.  5) Chronic Care/Health Maintenance: -he is on ACEI/ARB and not on Statin medications and is encouraged to initiate and continue to follow up with Ophthalmology, Dentist, Podiatrist at least yearly or according to recommendations, and advised to QUIT SMOKING. I have recommended yearly flu vaccine and pneumonia vaccine at least every 5 years; moderate intensity exercise for up to 150 minutes weekly; and sleep for at least 7 hours a day.  - he is advised to maintain close follow up with Bucio, Julian Reil, FNP for primary care needs, as well as his other providers for optimal and coordinated care.   - Time spent in this patient care: 60 min, of which > 50% was spent in counseling him about his diabetes and the rest reviewing his blood  glucose logs, discussing his hypoglycemia and hyperglycemia episodes, reviewing his current and previous labs/studies (including abstraction from other facilities) and medications doses and developing a long term treatment plan based on the latest standards of care/guidelines; and documenting his care.    Please refer to Patient Instructions for Blood Glucose Monitoring and Insulin/Medications Dosing Guide" in media tab for additional information. Please also refer to "Patient Self Inventory" in the Media tab for reviewed elements of pertinent patient history.  Dustin Moore participated in the discussions, expressed understanding, and voiced agreement with the above plans.  All questions were answered to his satisfaction. he is encouraged to contact clinic should he have any questions or concerns prior to his return visit.     Follow up plan: - Return in about 3 months (around 06/14/2023) for Diabetes F/U with A1c in office, No previsit labs, Bring meter and logs.    Ronny Bacon, [facility] [address] [place], [place] [address] Phone: [phone] Fax: [phone]  03/14/2023, 5:03 PM

## 2023-03-14 NOTE — Patient Instructions (Signed)

## 2023-03-17 ENCOUNTER — Encounter: Payer: Self-pay | Admitting: Nurse Practitioner

## 2023-03-20 ENCOUNTER — Other Ambulatory Visit (HOSPITAL_COMMUNITY): Payer: Self-pay

## 2023-03-20 ENCOUNTER — Telehealth: Payer: Self-pay

## 2023-03-20 NOTE — Telephone Encounter (Signed)
See patient message about possible PA needed for his Dexcom G7

## 2023-03-20 NOTE — Telephone Encounter (Signed)
Pharmacy Patient Advocate Encounter  Received notification from [facility] that Prior Authorization for Dexcom G7 sensor has been APPROVED through 09/15/23   PA #/Case ID/Reference #: [id number]

## 2023-03-20 NOTE — Telephone Encounter (Signed)
Pharmacy Patient Advocate Encounter   Received notification from Pt Calls Messages that prior authorization for Dexcom G7 sensor is required/requested.   Insurance verification completed.   The patient is insured through [employer] .   Per test claim: PA required; PA submitted to [facility] via CoverMyMeds Key/confirmation #/EOC [id number] Status is pending

## 2023-03-21 NOTE — Telephone Encounter (Signed)
Patient was called , left a voicemail sharing with him that he had been approved and to follow up with his pharmacy.

## 2023-05-10 ENCOUNTER — Ambulatory Visit: Payer: Medicaid Other | Admitting: Nutrition

## 2023-05-24 ENCOUNTER — Telehealth: Payer: Self-pay | Admitting: Internal Medicine

## 2023-05-24 NOTE — Telephone Encounter (Signed)
Patient is calling wishing to schedule a new patient appointment with you- awaiting your approval. Please advise on scheduling.   Thank you!

## 2023-06-02 ENCOUNTER — Telehealth: Payer: Self-pay | Admitting: Nurse Practitioner

## 2023-06-02 ENCOUNTER — Other Ambulatory Visit: Payer: Self-pay | Admitting: *Deleted

## 2023-06-02 DIAGNOSIS — Z7984 Long term (current) use of oral hypoglycemic drugs: Secondary | ICD-10-CM

## 2023-06-02 DIAGNOSIS — Z794 Long term (current) use of insulin: Secondary | ICD-10-CM

## 2023-06-02 DIAGNOSIS — Z7985 Long-term (current) use of injectable non-insulin antidiabetic drugs: Secondary | ICD-10-CM

## 2023-06-02 MED ORDER — METFORMIN HCL 500 MG PO TABS: 500.0000 mg | ORAL_TABLET | Freq: Two times a day (BID) | ORAL | 0 refills | Status: DC

## 2023-06-02 NOTE — Telephone Encounter (Signed)
Rx refill sent.

## 2023-06-02 NOTE — Telephone Encounter (Signed)
Pts fiance called requesting prescription of Metformin be sent into [facility] on [address].

## 2023-06-02 NOTE — Telephone Encounter (Signed)
Rx was sent to the [facility] on [address] in [place] for Metformin 500 mg twice a day.

## 2023-06-13 ENCOUNTER — Encounter: Payer: Self-pay | Admitting: Internal Medicine

## 2023-06-13 ENCOUNTER — Ambulatory Visit: Payer: Medicaid Other | Admitting: Internal Medicine

## 2023-06-13 VITALS — BP 136/74 | HR 70 | Ht 69.0 in | Wt 225.8 lb

## 2023-06-13 DIAGNOSIS — Z720 Tobacco use: Secondary | ICD-10-CM

## 2023-06-13 DIAGNOSIS — Z1211 Encounter for screening for malignant neoplasm of colon: Secondary | ICD-10-CM

## 2023-06-13 DIAGNOSIS — Z114 Encounter for screening for human immunodeficiency virus [HIV]: Secondary | ICD-10-CM

## 2023-06-13 DIAGNOSIS — Z794 Long term (current) use of insulin: Secondary | ICD-10-CM

## 2023-06-13 DIAGNOSIS — Z125 Encounter for screening for malignant neoplasm of prostate: Secondary | ICD-10-CM

## 2023-06-13 DIAGNOSIS — E66811 Obesity, class 1: Secondary | ICD-10-CM

## 2023-06-13 DIAGNOSIS — F17210 Nicotine dependence, cigarettes, uncomplicated: Secondary | ICD-10-CM | POA: Diagnosis not present

## 2023-06-13 DIAGNOSIS — E782 Mixed hyperlipidemia: Secondary | ICD-10-CM | POA: Diagnosis not present

## 2023-06-13 DIAGNOSIS — E1165 Type 2 diabetes mellitus with hyperglycemia: Secondary | ICD-10-CM

## 2023-06-13 DIAGNOSIS — Z23 Encounter for immunization: Secondary | ICD-10-CM | POA: Diagnosis not present

## 2023-06-13 DIAGNOSIS — J4540 Moderate persistent asthma, uncomplicated: Secondary | ICD-10-CM

## 2023-06-13 DIAGNOSIS — Z6833 Body mass index (BMI) 33.0-33.9, adult: Secondary | ICD-10-CM

## 2023-06-13 DIAGNOSIS — I10 Essential (primary) hypertension: Secondary | ICD-10-CM

## 2023-06-13 DIAGNOSIS — Z1159 Encounter for screening for other viral diseases: Secondary | ICD-10-CM

## 2023-06-13 DIAGNOSIS — E559 Vitamin D deficiency, unspecified: Secondary | ICD-10-CM

## 2023-06-13 MED ORDER — ROSUVASTATIN CALCIUM 5 MG PO TABS
5.0000 mg | ORAL_TABLET | Freq: Every evening | ORAL | 1 refills | Status: DC
Start: 2023-06-13 — End: 2023-09-14

## 2023-06-13 MED ORDER — BUDESONIDE-FORMOTEROL FUMARATE 160-4.5 MCG/ACT IN AERO: 2.0000 | INHALATION_SPRAY | Freq: Two times a day (BID) | RESPIRATORY_TRACT | 5 refills | Status: DC

## 2023-06-13 MED ORDER — ALBUTEROL SULFATE HFA 108 (90 BASE) MCG/ACT IN AERS: 1.0000 | INHALATION_SPRAY | Freq: Four times a day (QID) | RESPIRATORY_TRACT | 3 refills | Status: DC | PRN

## 2023-06-13 NOTE — Assessment & Plan Note (Signed)
BP Readings from Last 1 Encounters:  06/13/23 136/74   Well-controlled with losartan-HCTZ 50-12.5 mg QD Counseled for compliance with the medications Advised DASH diet and moderate exercise/walking, at least 150 mins/week

## 2023-06-13 NOTE — Assessment & Plan Note (Signed)
Smokes about 0.25 pack/day  Asked about quitting: confirms that he/she currently smokes cigarettes Advise to quit smoking: Educated about QUITTING to reduce the risk of cancer, cardio and cerebrovascular disease. Assess willingness: Unwilling to quit at this time, but is working on cutting back. Assist with counseling and pharmacotherapy: Counseled for 5 minutes and literature provided. Arrange for follow up: follow up in 3 months and continue to offer help. 

## 2023-06-13 NOTE — Assessment & Plan Note (Signed)
Started Crestor considering his  history of type II DM

## 2023-06-13 NOTE — Assessment & Plan Note (Addendum)
Lab Results  Component Value Date   HGBA1C 9.6 (A) 03/14/2023   Uncontrolled, but improving On Basaglar 30 units nightly, ISS and metformin 500 mg twice daily Did not tolerate GLP-1 agonists Advised to follow diabetic diet On ARB, started statin F/u CMP and lipid panel Diabetic eye exam: Advised to follow up with Ophthalmology for diabetic eye exam

## 2023-06-13 NOTE — Progress Notes (Signed)
New Patient Office Visit  Subjective:  Patient ID: Dustin Moore, male    DOB: [birth year]  Age: 62 y.o. MRN: [id number]  CC:  Chief Complaint  Patient presents with   Establish Care    HPI Dustin Moore is a 62 y.o. male with past medical history of HTN, type II DM, HLD, asthma, obesity and tobacco abuse who presents for establishing care.  HTN: BP is well-controlled. Takes losartan-HCTZ  50-12.5 mg QD regularly. Patient denies headache, dizziness, chest pain, dyspnea or palpitations.  Type II DM: He takes Basaglar 30 units nightly, ISS and metformin 500 mg twice daily.  He did not tolerate Trulicity or Ozempic in the past.  Currently followed by endocrinology.  He has CGM-averages are as below:  7 days: 213 30 days: 186 90 days: 185  He is currently not on statin, but agrees to started to reduce CVA and CAD risk.  Asthma: He uses Symbicort and as needed albuterol currently.  He requests refill of Symbicort currently.  He has cut down smoking, currently smokes 3 to 4 cigarettes a day.  Denies any dyspnea or wheezing currently.   Past Medical History:  Diagnosis Date   Asthma    Diabetes mellitus without complication (HCC)    Hypertension    Rotator cuff disorder, left     Past Surgical History:  Procedure Laterality Date   ROTATOR CUFF REPAIR Left 08/2021   TONSILLECTOMY AND ADENOIDECTOMY     TOTAL HIP ARTHROPLASTY Left 2013    Family History  Problem Relation Age of Onset   Diabetes Mother    Cirrhosis Mother    Heart disease Father    Heart attack Father    Heart disease Paternal Uncle     Social History   Socioeconomic History   Marital status: Single    Spouse name: Not on file   Number of children: Not on file   Years of education: Not on file   Highest education level: Not on file  Occupational History   Not on file  Tobacco Use   Smoking status: Every Day    Current packs/day: 0.25    Average packs/day: 0.3 packs/day for 39.0 years (9.8 ttl pk-yrs)     Types: Cigarettes   Smokeless tobacco: Never  Vaping Use   Vaping status: Never Used  Substance and Sexual Activity   Alcohol use: Not Currently    Comment: occasinally on weekends    Drug use: Not Currently    Types: Marijuana, Cocaine    Comment: last percocet use 01/2022 (no MJ or cocaine since he was in his 52s)   Sexual activity: Not on file  Other Topics Concern   Not on file  Social History Narrative   Not on file   Social Determinants of Health   Financial Resource Strain: Not on File (03/28/2018)   Received from [facility], [facility]    Financial Resource Strain: 0  Food Insecurity: Not on File (05/04/2023)   Received from [facility] Insecurity    Food: 0  Transportation Needs: Not on File (03/28/2018)   Received from [facility] Needs    Transportation: 0  Physical Activity: Not on File (03/28/2018)   Received from [place], [place]   Physical Activity    Physical Activity: 0  Stress: Not on File (03/28/2018)   Received from [facility], [place]   Stress    Stress: 0  Social Connections: Not on File (04/27/2023)  Received from [employer]    Connectedness: 0  Intimate Partner Violence: Not on file    ROS Review of Systems  Constitutional:  Negative for chills and fever.  HENT:  Negative for congestion and sore throat.   Eyes:  Negative for pain and discharge.  Respiratory:  Negative for cough and shortness of breath.   Cardiovascular:  Negative for chest pain and palpitations.  Gastrointestinal:  Negative for diarrhea, nausea and vomiting.  Endocrine: Negative for polydipsia and polyuria.  Genitourinary:  Negative for dysuria and hematuria.  Musculoskeletal:  Negative for neck pain and neck stiffness.  Skin:  Negative for rash.  Neurological:  Negative for dizziness, weakness, numbness and headaches.  Psychiatric/Behavioral:  Negative for agitation and behavioral problems.     Objective:   Today's Vitals:  BP 136/74 (BP Location: Right Arm, Patient Position: Sitting, Cuff Size: Large)   Pulse 70   Ht 5\' 9"  (1.753 m)   Wt 225 lb 12.8 oz (102.4 kg)   SpO2 95%   BMI 33.34 kg/m   Physical Exam Vitals reviewed.  Constitutional:      General: He is not in acute distress.    Appearance: He is not diaphoretic.  HENT:     Head: Normocephalic and atraumatic.     Nose: Nose normal.     Mouth/Throat:     Mouth: Mucous membranes are moist.  Eyes:     General: No scleral icterus.    Extraocular Movements: Extraocular movements intact.  Cardiovascular:     Rate and Rhythm: Normal rate and regular rhythm.     Heart sounds: Normal heart sounds. No murmur heard. Pulmonary:     Breath sounds: Normal breath sounds. No wheezing or rales.  Musculoskeletal:     Cervical back: Neck supple. No tenderness.     Right lower leg: No edema.     Left lower leg: No edema.  Skin:    General: Skin is warm.     Findings: No rash.  Neurological:     General: No focal deficit present.     Mental Status: He is alert and oriented to person, place, and time.  Psychiatric:        Mood and Affect: Mood normal.        Behavior: Behavior normal.     Assessment & Plan:   Problem List Items Addressed This Visit       Cardiovascular and Mediastinum   Primary hypertension    BP Readings from Last 1 Encounters:  06/13/23 136/74   Well-controlled with losartan-HCTZ 50-12.5 mg QD Counseled for compliance with the medications Advised DASH diet and moderate exercise/walking, at least 150 mins/week       Relevant Medications   losartan-hydrochlorothiazide (HYZAAR) 50-12.5 MG tablet   rosuvastatin (CRESTOR) 5 MG tablet   Other Relevant Orders   TSH   CMP14+EGFR   CBC with Differential/Platelet     Endocrine   Type 2 diabetes mellitus with hyperglycemia, with long-term current use of insulin (HCC) - Primary    Lab Results  Component Value Date   HGBA1C 9.6 (A) 03/14/2023   Uncontrolled, but  improving On Basaglar 30 units nightly, ISS and metformin 500 mg twice daily Did not tolerate GLP-1 agonists Advised to follow diabetic diet On ARB, started statin F/u CMP and lipid panel Diabetic eye exam: Advised to follow up with Ophthalmology for diabetic eye exam       Relevant Medications   losartan-hydrochlorothiazide (HYZAAR) 50-12.5 MG tablet  rosuvastatin (CRESTOR) 5 MG tablet   Other Relevant Orders   CMP14+EGFR   Urine Microalbumin w/creat. ratio     Other   HLD (hyperlipidemia)    Started Crestor considering his  history of type II DM      Relevant Medications   losartan-hydrochlorothiazide (HYZAAR) 50-12.5 MG tablet   rosuvastatin (CRESTOR) 5 MG tablet   Other Relevant Orders   Lipid panel   Tobacco use    Smokes about 0.25 pack/day  Asked about quitting: confirms that he/she currently smokes cigarettes Advise to quit smoking: Educated about QUITTING to reduce the risk of cancer, cardio and cerebrovascular disease. Assess willingness: Unwilling to quit at this time, but is working on cutting back. Assist with counseling and pharmacotherapy: Counseled for 5 minutes and literature provided. Arrange for follow up: follow up in 3 months and continue to offer help.      Class 1 obesity with serious comorbidity and body mass index (BMI) of 33.0 to 33.9 in adult    BMI Readings from Last 3 Encounters:  06/13/23 33.34 kg/m  03/14/23 35.07 kg/m  08/11/22 33.62 kg/m   Advised to follow low-carb diet and perform moderate exercise/walking at least 150 minutes/week      Other Visit Diagnoses     Moderate persistent asthma, unspecified whether complicated       Relevant Medications   budesonide-formoterol (SYMBICORT) 160-4.5 MCG/ACT inhaler   albuterol (PROVENTIL HFA) 108 (90 Base) MCG/ACT inhaler   Prostate cancer screening       Relevant Orders   PSA   Encounter for screening for HIV       Relevant Orders   HIV antibody (with reflex)   Need for  hepatitis C screening test       Relevant Orders   Hepatitis C Antibody   Vitamin D deficiency       Relevant Orders   VITAMIN D 25 Hydroxy (Vit-D Deficiency, Fractures)   Colon cancer screening       Relevant Orders   Cologuard   Encounter for immunization       Relevant Orders   Flu vaccine trivalent PF, 6mos and older(Flulaval,Afluria,Fluarix,Fluzone) (Completed)   Varicella-zoster vaccine IM (Completed)       Outpatient Encounter Medications as of 06/13/2023  Medication Sig   aspirin EC 81 MG tablet Take 81 mg by mouth daily.   B Complex-C (B-COMPLEX WITH VITAMIN C) tablet Take 1 tablet by mouth daily.   Continuous Glucose Sensor (DEXCOM G7 SENSOR) MISC Inject 1 Application into the skin as directed. Change sensor every 10 days as directed.   Insulin Glargine (BASAGLAR KWIKPEN [place]) Inject 30 Units into the skin at bedtime.   insulin lispro (HUMALOG KWIKPEN) 100 UNIT/ML KiwkPen Inject 8-14 Units into the skin 3 (three) times daily.   losartan-hydrochlorothiazide (HYZAAR) 50-12.5 MG tablet Take 1 tablet by mouth daily.   MAGNESIUM PO Take 500 mg by mouth daily.   metFORMIN (GLUCOPHAGE) 500 MG tablet Take 1 tablet (500 mg total) by mouth 2 (two) times daily with a meal.   Omega 3-6-9 Fatty Acids (TRIPLE OMEGA COMPLEX PO) Take 1 capsule by mouth daily.   Potassium 99 MG TABS Take 1 tablet by mouth daily as needed (for cramps).   rosuvastatin (CRESTOR) 5 MG tablet Take 1 tablet (5 mg total) by mouth every evening.   [DISCONTINUED] albuterol (PROVENTIL HFA) 108 (90 Base) MCG/ACT inhaler INHALE 2 PUFFS INTO THE LUNGS EVERY 4 HOURS AS NEEDED FOR SHORTNESS OF BREATH OR WHEEZING.   [  DISCONTINUED] budesonide-formoterol (SYMBICORT) 160-4.5 MCG/ACT inhaler Inhale 2 puffs into the lungs in the morning and at bedtime.   [DISCONTINUED] hydrochlorothiazide (HYDRODIURIL) 25 MG tablet Take 12.5 mg by mouth daily.   [DISCONTINUED] losartan (COZAAR) 50 MG tablet Take 50 mg by mouth daily.   albuterol  (PROVENTIL HFA) 108 (90 Base) MCG/ACT inhaler Inhale 1-2 puffs into the lungs every 6 (six) hours as needed for wheezing or shortness of breath.   budesonide-formoterol (SYMBICORT) 160-4.5 MCG/ACT inhaler Inhale 2 puffs into the lungs in the morning and at bedtime.   No facility-administered encounter medications on file as of 06/13/2023.    Follow-up: Return in about 4 months (around 10/11/2023) for HTN and DM.   Anabel Halon, MD

## 2023-06-13 NOTE — Patient Instructions (Signed)
Please start taking Rosuvastatin as prescribed.  Please continue to take medications as prescribed.  Please continue to follow low carb diet and perform moderate exercise/walking at least 150 mins/week.

## 2023-06-13 NOTE — Assessment & Plan Note (Signed)
BMI Readings from Last 3 Encounters:  06/13/23 33.34 kg/m  03/14/23 35.07 kg/m  08/11/22 33.62 kg/m   Advised to follow low-carb diet and perform moderate exercise/walking at least 150 minutes/week

## 2023-06-14 ENCOUNTER — Ambulatory Visit: Payer: Medicaid Other | Admitting: Nurse Practitioner

## 2023-06-14 DIAGNOSIS — Z7984 Long term (current) use of oral hypoglycemic drugs: Secondary | ICD-10-CM

## 2023-06-14 DIAGNOSIS — E559 Vitamin D deficiency, unspecified: Secondary | ICD-10-CM

## 2023-06-14 DIAGNOSIS — Z7985 Long-term (current) use of injectable non-insulin antidiabetic drugs: Secondary | ICD-10-CM

## 2023-06-14 DIAGNOSIS — E782 Mixed hyperlipidemia: Secondary | ICD-10-CM

## 2023-06-14 DIAGNOSIS — E1165 Type 2 diabetes mellitus with hyperglycemia: Secondary | ICD-10-CM

## 2023-06-14 DIAGNOSIS — I10 Essential (primary) hypertension: Secondary | ICD-10-CM

## 2023-06-14 DIAGNOSIS — Z794 Long term (current) use of insulin: Secondary | ICD-10-CM

## 2023-06-15 ENCOUNTER — Telehealth: Payer: Self-pay | Admitting: Internal Medicine

## 2023-06-15 LAB — CBC WITH DIFFERENTIAL/PLATELET
Basophils Absolute: 0 10*3/uL (ref 0.0–0.2)
Basos: 0 %
EOS (ABSOLUTE): 0.7 10*3/uL — ABNORMAL HIGH (ref 0.0–0.4)
Eos: 12 %
Hematocrit: 35.2 % — ABNORMAL LOW (ref 37.5–51.0)
Hemoglobin: 11.5 g/dL — ABNORMAL LOW (ref 13.0–17.7)
Immature Grans (Abs): 0 10*3/uL (ref 0.0–0.1)
Immature Granulocytes: 0 %
Lymphocytes Absolute: 2.1 10*3/uL (ref 0.7–3.1)
Lymphs: 35 %
MCH: 29.6 pg (ref 26.6–33.0)
MCHC: 32.7 g/dL (ref 31.5–35.7)
MCV: 91 fL (ref 79–97)
Monocytes Absolute: 0.4 10*3/uL (ref 0.1–0.9)
Monocytes: 7 %
Neutrophils Absolute: 2.8 10*3/uL (ref 1.4–7.0)
Neutrophils: 46 %
Platelets: 156 10*3/uL (ref 150–450)
RBC: 3.88 x10E6/uL — ABNORMAL LOW (ref 4.14–5.80)
RDW: 12.6 % (ref 11.6–15.4)
WBC: 6.1 10*3/uL (ref 3.4–10.8)

## 2023-06-15 LAB — CMP14+EGFR
ALT: 30 [IU]/L (ref 0–44)
AST: 37 [IU]/L (ref 0–40)
Albumin: 4.2 g/dL (ref 3.8–4.9)
Alkaline Phosphatase: 115 [IU]/L (ref 44–121)
BUN/Creatinine Ratio: 19 (ref 10–24)
BUN: 22 mg/dL (ref 8–27)
Bilirubin Total: 0.3 mg/dL (ref 0.0–1.2)
CO2: 20 mmol/L (ref 20–29)
Calcium: 9.3 mg/dL (ref 8.6–10.2)
Chloride: 102 mmol/L (ref 96–106)
Creatinine, Ser: 1.18 mg/dL (ref 0.76–1.27)
Globulin, Total: 2.8 g/dL (ref 1.5–4.5)
Glucose: 177 mg/dL — ABNORMAL HIGH (ref 70–99)
Potassium: 4.4 mmol/L (ref 3.5–5.2)
Sodium: 137 mmol/L (ref 134–144)
Total Protein: 7 g/dL (ref 6.0–8.5)
eGFR: 71 mL/min/{1.73_m2} (ref >59)

## 2023-06-15 LAB — PSA: Prostate Specific Ag, Serum: 0.4 ng/mL (ref 0.0–4.0)

## 2023-06-15 LAB — LIPID PANEL
Chol/HDL Ratio: 3.5 ratio (ref 0.0–5.0)
Cholesterol, Total: 155 mg/dL (ref 100–199)
HDL: 44 mg/dL
LDL Chol Calc (NIH): 86 mg/dL (ref 0–99)
Triglycerides: 139 mg/dL (ref 0–149)
VLDL Cholesterol Cal: 25 mg/dL (ref 5–40)

## 2023-06-15 LAB — HEPATITIS C ANTIBODY: Hep C Virus Ab: NONREACTIVE

## 2023-06-15 LAB — MICROALBUMIN / CREATININE URINE RATIO
Creatinine, Urine: 52.9 mg/dL
Microalb/Creat Ratio: 9 mg/g{creat} (ref 0–29)
Microalbumin, Urine: 4.8 ug/mL

## 2023-06-15 LAB — TSH: TSH: 1.82 u[IU]/mL (ref 0.450–4.500)

## 2023-06-15 LAB — VITAMIN D 25 HYDROXY (VIT D DEFICIENCY, FRACTURES): Vit D, 25-Hydroxy: 21.1 ng/mL — ABNORMAL LOW (ref 30.0–100.0)

## 2023-06-15 LAB — HIV ANTIBODY (ROUTINE TESTING W REFLEX): HIV Screen 4th Generation wRfx: NONREACTIVE

## 2023-06-15 NOTE — Telephone Encounter (Signed)
Call for test results

## 2023-06-16 ENCOUNTER — Ambulatory Visit: Payer: Medicaid Other | Admitting: Nurse Practitioner

## 2023-06-16 ENCOUNTER — Encounter: Payer: Self-pay | Admitting: Nurse Practitioner

## 2023-06-16 VITALS — BP 119/72 | HR 69 | Ht 69.0 in | Wt 227.4 lb

## 2023-06-16 DIAGNOSIS — E1165 Type 2 diabetes mellitus with hyperglycemia: Secondary | ICD-10-CM

## 2023-06-16 DIAGNOSIS — Z794 Long term (current) use of insulin: Secondary | ICD-10-CM

## 2023-06-16 DIAGNOSIS — Z7985 Long-term (current) use of injectable non-insulin antidiabetic drugs: Secondary | ICD-10-CM

## 2023-06-16 DIAGNOSIS — Z7984 Long term (current) use of oral hypoglycemic drugs: Secondary | ICD-10-CM

## 2023-06-16 LAB — POCT GLYCOSYLATED HEMOGLOBIN (HGB A1C): Hemoglobin A1C: 8.2 % — AB (ref 4.0–5.6)

## 2023-06-16 MED ORDER — PEN NEEDLES 31G X 8 MM MISC: 3 refills | Status: DC

## 2023-06-16 MED ORDER — METFORMIN HCL 500 MG PO TABS: 500.0000 mg | ORAL_TABLET | Freq: Two times a day (BID) | ORAL | 1 refills | Status: DC

## 2023-06-16 MED ORDER — INSULIN LISPRO (1 UNIT DIAL) 100 UNIT/ML (KWIKPEN): 8.0000 [IU] | PEN_INJECTOR | Freq: Three times a day (TID) | SUBCUTANEOUS | 3 refills | Status: DC

## 2023-06-16 MED ORDER — BASAGLAR KWIKPEN 100 UNIT/ML ~~LOC~~ SOPN: 30.0000 [IU] | PEN_INJECTOR | Freq: Every day | SUBCUTANEOUS | 3 refills | Status: DC

## 2023-06-16 NOTE — Progress Notes (Signed)
Endocrinology Follow Up Note       06/16/2023, 11:26 AM   Subjective:    Patient ID: Dustin Moore, male    DOB: 03-20-1961.  Dustin Moore is being seen in follow up after being seen in consultation for management of currently uncontrolled symptomatic diabetes requested by  Anabel Halon, MD.   Past Medical History:  Diagnosis Date   Asthma    Diabetes mellitus without complication (HCC)    Hypertension    Rotator cuff disorder, left     Past Surgical History:  Procedure Laterality Date   ROTATOR CUFF REPAIR Left 08/2021   TONSILLECTOMY AND ADENOIDECTOMY     TOTAL HIP ARTHROPLASTY Left 2013    Social History   Socioeconomic History   Marital status: Single    Spouse name: Not on file   Number of children: Not on file   Years of education: Not on file   Highest education level: Not on file  Occupational History   Not on file  Tobacco Use   Smoking status: Every Day    Current packs/day: 0.25    Average packs/day: 0.3 packs/day for 39.0 years (9.8 ttl pk-yrs)    Types: Cigarettes   Smokeless tobacco: Never  Vaping Use   Vaping status: Never Used  Substance and Sexual Activity   Alcohol use: Not Currently    Comment: occasinally on weekends    Drug use: Not Currently    Types: Marijuana, Cocaine    Comment: last percocet use 01/2022 (no MJ or cocaine since he was in his 3s)   Sexual activity: Not on file  Other Topics Concern   Not on file  Social History Narrative   Not on file   Social Determinants of Health   Financial Resource Strain: Not on File (03/28/2018)   Received from [employer], [employer]    Financial Resource Strain: 0  Food Insecurity: Not on File (05/04/2023)   Received from [facility] Insecurity    Food: 0  Transportation Needs: Not on File (03/28/2018)   Received from [employer], [employer] Needs    Transportation: 0  Physical Activity: Not  on File (03/28/2018)   Received from [place], [place]   Physical Activity    Physical Activity: 0  Stress: Not on File (03/28/2018)   Received from [employer], [place]   Stress    Stress: 0  Social Connections: Not on File (04/27/2023)   Received from [employer]   Social Connections    Connectedness: 0    Family History  Problem Relation Age of Onset   Diabetes Mother    Cirrhosis Mother    Heart disease Father    Heart attack Father    Heart disease Paternal Uncle     Outpatient Encounter Medications as of 06/16/2023  Medication Sig   albuterol (PROVENTIL HFA) 108 (90 Base) MCG/ACT inhaler Inhale 1-2 puffs into the lungs every 6 (six) hours as needed for wheezing or shortness of breath.   aspirin EC 81 MG tablet Take 81 mg by mouth daily.   B Complex-C (B-COMPLEX WITH VITAMIN C) tablet Take 1 tablet by mouth daily.   budesonide-formoterol (SYMBICORT)  160-4.5 MCG/ACT inhaler Inhale 2 puffs into the lungs in the morning and at bedtime.   Continuous Glucose Sensor (DEXCOM G7 SENSOR) MISC Inject 1 Application into the skin as directed. Change sensor every 10 days as directed.   insulin lispro (HUMALOG KWIKPEN) 100 UNIT/ML KwikPen Inject 8-14 Units into the skin 3 (three) times daily.   Insulin Pen Needle (PEN NEEDLES) 31G X 8 MM MISC Use to inject insulin 4 times daily   losartan-hydrochlorothiazide (HYZAAR) 50-12.5 MG tablet Take 1 tablet by mouth daily.   MAGNESIUM PO Take 500 mg by mouth daily.   Omega 3-6-9 Fatty Acids (TRIPLE OMEGA COMPLEX PO) Take 1 capsule by mouth daily.   Potassium 99 MG TABS Take 1 tablet by mouth daily as needed (for cramps).   rosuvastatin (CRESTOR) 5 MG tablet Take 1 tablet (5 mg total) by mouth every evening.   [DISCONTINUED] Insulin Glargine (BASAGLAR KWIKPEN [place]) Inject 30 Units into the skin at bedtime.   [DISCONTINUED] insulin lispro (HUMALOG KWIKPEN) 100 UNIT/ML KiwkPen Inject 8-14 Units into the skin 3 (three) times daily.   [DISCONTINUED] metFORMIN (GLUCOPHAGE)  500 MG tablet Take 1 tablet (500 mg total) by mouth 2 (two) times daily with a meal.   Insulin Glargine (BASAGLAR KWIKPEN) 100 UNIT/ML Inject 30 Units into the skin at bedtime.   metFORMIN (GLUCOPHAGE) 500 MG tablet Take 1 tablet (500 mg total) by mouth 2 (two) times daily with a meal.   No facility-administered encounter medications on file as of 06/16/2023.    ALLERGIES: Allergies  Allergen Reactions   Contrast Media [Iodinated Contrast Media] Anaphylaxis    VACCINATION STATUS: Immunization History  Administered Date(s) Administered   Fluzone Influenza virus vaccine,trivalent (IIV3), split virus 05/01/2013, 07/01/2015, 07/13/2017, 07/02/2018   Influenza, Seasonal, Injecte, Preservative Fre 06/13/2023   Influenza,inj,Quad PF,6+ Mos 07/08/2020   PFIZER(Purple Top)SARS-COV-2 Vaccination 11/14/2019, 12/06/2019   Zoster Recombinant(Shingrix) 06/13/2023    Diabetes He presents for his follow-up diabetic visit. He has type 2 diabetes mellitus. Onset time: diagnosed at approx age of 62. His disease course has been improving. There are no hypoglycemic associated symptoms. Associated symptoms include blurred vision, fatigue, foot paresthesias, polydipsia and polyuria. There are no hypoglycemic complications. Symptoms are improving. 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 Risk factors for coronary artery disease include diabetes mellitus, dyslipidemia, family history, obesity, male sex, hypertension, sedentary lifestyle and tobacco exposure. Current diabetic treatment includes insulin injections. He is compliant with treatment most of the time. His weight is fluctuating minimally. He is following a generally unhealthy diet. When asked about meal planning, he reported none. He has not had a previous visit with a dietitian. He participates in exercise intermittently. His overall blood glucose range is >200 mg/dl. (He presents today with his CGM and logs showing improving  glycemic profile.  His POCT A1c today is 8.2%, improving from last visit of 9.6%.  Analysis of his CGM shows TIR 32%, TAR 68%, TBR 0% with a GMI of 8.4%.  He admits he has taken more novolog at times to help bring down high spikes after eating and ends up dropping too low, and then he over-corrects.  He also questions whether or not his insulin he has at home is good- has an over- abundance of supply from [facility] and has not been paying attention to the expiration dates.) An ACE inhibitor/angiotensin II receptor blocker is being taken. He does not see a podiatrist.Eye exam is not current.     Review of systems  Constitutional: + Minimally  fluctuating body weight, current Body mass index is 33.58 kg/m., no fatigue, no subjective hyperthermia, no subjective hypothermia, poor dental health (needing oral surgery to removed affected teeth and get dentures) Eyes: no blurry vision, no xerophthalmia ENT: no sore throat, no nodules palpated in throat, no dysphagia/odynophagia, no hoarseness Cardiovascular: no chest pain, no shortness of breath, no palpitations, no leg swelling Respiratory: no cough, no shortness of breath Gastrointestinal: no nausea/vomiting/diarrhea Musculoskeletal: no muscle/joint aches Skin: no rashes, no hyperemia Neurological: no tremors, no numbness, no tingling, no dizziness Psychiatric: no depression, no anxiety  Objective:     BP 119/72 (BP Location: Left Arm, Patient Position: Sitting, Cuff Size: Large)   Pulse 69   Ht 5\' 9"  (1.753 m)   Wt 227 lb 6.4 oz (103.1 kg)   BMI 33.58 kg/m   Wt Readings from Last 3 Encounters:  06/16/23 227 lb 6.4 oz (103.1 kg)  06/13/23 225 lb 12.8 oz (102.4 kg)  03/14/23 225 lb 9.6 oz (102.3 kg)     BP Readings from Last 3 Encounters:  06/16/23 119/72  06/13/23 136/74  03/14/23 116/79     Physical Exam- Limited  Constitutional:  Body mass index is 33.58 kg/m. , not in acute distress, normal state of mind Eyes:  EOMI, no  exophthalmos Musculoskeletal: no gross deformities, strength intact in all four extremities, no gross restriction of joint movements Skin:  no rashes, no hyperemia Neurological: no tremor with outstretched hands  Diabetic Foot Exam - Simple   No data filed      CMP ( most recent) CMP     Component Value Date/Time   NA 137 06/13/2023 0918   K 4.4 06/13/2023 0918   CL 102 06/13/2023 0918   CO2 20 06/13/2023 0918   GLUCOSE 177 (H) 06/13/2023 0918   BUN 22 06/13/2023 0918   CREATININE 1.18 06/13/2023 0918   CALCIUM 9.3 06/13/2023 0918   PROT 7.0 06/13/2023 0918   ALBUMIN 4.2 06/13/2023 0918   AST 37 06/13/2023 0918   ALT 30 06/13/2023 0918   ALKPHOS 115 06/13/2023 0918   BILITOT 0.3 06/13/2023 0918   EGFR 71 06/13/2023 0918     Diabetic Labs (most recent): Lab Results  Component Value Date   HGBA1C 8.2 (A) 06/16/2023   HGBA1C 9.6 (A) 03/14/2023     Lipid Panel ( most recent) Lipid Panel     Component Value Date/Time   CHOL 155 06/13/2023 0918   TRIG 139 06/13/2023 0918   HDL 44 06/13/2023 0918   CHOLHDL 3.5 06/13/2023 0918   LDLCALC 86 06/13/2023 0918   LABVLDL 25 06/13/2023 0918      Lab Results  Component Value Date   TSH 1.820 06/13/2023           Assessment & Plan:   1) Type 2 diabetes mellitus with hyperglycemia, with long-term current use of insulin (HCC)  He presents today with his CGM and logs showing improving glycemic profile.  His POCT A1c today is 8.2%, improving from last visit of 9.6%.  Analysis of his CGM shows TIR 32%, TAR 68%, TBR 0% with a GMI of 8.4%.  He admits he has taken more novolog at times to help bring down high spikes after eating and ends up dropping too low, and then he over-corrects.  He also questions whether or not his insulin he has at home is good- has an over- abundance of supply from [facility] and has not been paying attention to the expiration dates.  - Dustin Moore has currently uncontrolled symptomatic type 2 DM  since 62 years of age.   -Recent labs reviewed.  - I had a long discussion with him about the progressive nature of diabetes and the pathology behind its complications. -his diabetes is complicated by neuropathy and mild CKD and he remains at a high risk for more acute and chronic complications which include CAD, CVA, CKD, retinopathy, and neuropathy. These are all discussed in detail with him.  The following Lifestyle Medicine recommendations according to American College of Lifestyle Medicine [facility]) were discussed and offered to patient and he agrees to start the journey:  A. Whole Foods, Plant-based plate comprising of fruits and vegetables, plant-based proteins, whole-grain carbohydrates was discussed in detail with the patient.   A list for source of those nutrients were also provided to the patient.  Patient will use only water or unsweetened tea for hydration. B.  The need to stay away from risky substances including alcohol, smoking; obtaining 7 to 9 hours of restorative sleep, at least 150 minutes of moderate intensity exercise weekly, the importance of healthy social connections,  and stress reduction techniques were discussed. C.  A full color page of  Calorie density of various food groups per pound showing examples of each food groups was provided to the patient.  - Nutritional counseling repeated at each appointment due to patients tendency to fall back in to old habits.  - The patient admits there is a room for improvement in their diet and drink choices. -  Suggestion is made for the patient to avoid simple carbohydrates from their diet including Cakes, Sweet Desserts / Pastries, Ice Cream, Soda (diet and regular), Sweet Tea, Candies, Chips, Cookies, Sweet Pastries, Store Bought Juices, Alcohol in Excess of 1-2 drinks a day, Artificial Sweeteners, Coffee Creamer, and "Sugar-free" Products. This will help patient to have stable blood glucose profile and potentially avoid unintended  weight gain.   - I encouraged the patient to switch to unprocessed or minimally processed complex starch and increased protein intake (animal or plant source), fruits, and vegetables.   - Patient is advised to stick to a routine mealtimes to eat 3 meals a day and avoid unnecessary snacks (to snack only to correct hypoglycemia).  - he will be scheduled with Norm Salt, RDN, CDE for diabetes education.  - I have approached him with the following individualized plan to manage his diabetes and patient agrees:   -He is advised to continue his Basaglar 30 units SQ nightly and Humalog to 8-14 units TID with meals if glucose is above 90 and he is eating (Specific instructions on how to titrate insulin dosage based on glucose readings given to patient in writing). He is warned not to take more insulin than what is prescribed.  He can also continue his Metformin 500 mg po twice daily after meals.  -he is encouraged to start monitoring glucose 4 times daily (using his CGM), before meals and before bed, and to call the clinic if he has readings less than 70 or above 300 for 3 tests in a row.    - he is warned not to take insulin without proper monitoring per orders. - Adjustment parameters are given to him for hypo and hyperglycemia in writing. - he is encouraged to call clinic for blood glucose levels less than 70 or above 300 mg /dl.  -He did not tolerate GLP1 in the past (he is high risk for development of pancreatitis on such medication given heavy smoking history).  -  Specific targets for  A1c; LDL, HDL, and Triglycerides were discussed with the patient.  2) Blood Pressure /Hypertension:  his blood pressure is controlled to target.   he is advised to continue his current medications including Losartan 50 mg po daily and hydrochlorothiazide 12.5 mg p.o. daily with breakfast.  3) Lipids/Hyperlipidemia:    Review of his recent lipid panel from 06/13/23 showed controlled LDL at 86. He is currently  on Crestor 5 mg po daily.  4)  Weight/Diet:  his Body mass index is 33.58 kg/m.  -  clearly complicating his diabetes care.   he is a candidate for weight loss. I discussed with him the fact that loss of 5 - 10% of his  current body weight will have the most impact on his diabetes management.  Exercise, and detailed carbohydrates information provided  -  detailed on discharge instructions.  5) Chronic Care/Health Maintenance: -he is on ACEI/ARB and not on Statin medications and is encouraged to initiate and continue to follow up with Ophthalmology, Dentist, Podiatrist at least yearly or according to recommendations, and advised to QUIT SMOKING. I have recommended yearly flu vaccine and pneumonia vaccine at least every 5 years; moderate intensity exercise for up to 150 minutes weekly; and sleep for at least 7 hours a day.  - he is advised to maintain close follow up with Anabel Halon, MD for primary care needs, as well as his other providers for optimal and coordinated care.     I spent  28  minutes in the care of the patient today including review of labs from CMP, Lipids, Thyroid Function, Hematology (current and previous including abstractions from other facilities); face-to-face time discussing  his blood glucose readings/logs, discussing hypoglycemia and hyperglycemia episodes and symptoms, medications doses, his options of short and long term treatment based on the latest standards of care / guidelines;  discussion about incorporating lifestyle medicine;  and documenting the encounter. Risk reduction counseling performed per USPSTF guidelines to reduce obesity and cardiovascular risk factors.     Please refer to Patient Instructions for Blood Glucose Monitoring and Insulin/Medications Dosing Guide"  in media tab for additional information. Please  also refer to " Patient Self Inventory" in the Media  tab for reviewed elements of pertinent patient history.  Dustin Moore participated in the  discussions, expressed understanding, and voiced agreement with the above plans.  All questions were answered to his satisfaction. he is encouraged to contact clinic should he have any questions or concerns prior to his return visit.     Follow up plan: - Return in about 3 months (around 09/16/2023) for Diabetes F/U with A1c in office, No previsit labs, Bring meter and logs.   Ronny Bacon, [facility] [address] [place], [place] [address] Phone: [phone] Fax: [phone]  06/16/2023, 11:26 AM

## 2023-06-19 ENCOUNTER — Other Ambulatory Visit: Payer: Self-pay | Admitting: Internal Medicine

## 2023-06-19 ENCOUNTER — Telehealth: Payer: Self-pay

## 2023-06-19 DIAGNOSIS — J449 Chronic obstructive pulmonary disease, unspecified: Secondary | ICD-10-CM | POA: Insufficient documentation

## 2023-06-19 MED ORDER — IPRATROPIUM-ALBUTEROL 0.5-2.5 (3) MG/3ML IN SOLN: RESPIRATORY_TRACT | 0 refills | Status: DC

## 2023-06-19 NOTE — Telephone Encounter (Signed)
Cheryl advised

## 2023-06-19 NOTE — Telephone Encounter (Signed)
Copied from CRM [id number]. Topic: Clinical - Medication Refill >> Jun 19, 2023  3:46 PM Prudencio Pair wrote: Most Recent Primary Care Visit:  Provider: Anabel Halon  Department: [facility]  Visit Type: NEW PATIENT  Date: 06/13/2023  Medication: Albuterol Inhaler  Has the patient contacted their pharmacy? Yes (Agent: If no, request that the patient contact the pharmacy for the refill. If patient does not wish to contact the pharmacy document the reason why and proceed with request.) (Agent: If yes, when and what did the pharmacy advise?)  Is this the correct pharmacy for this prescription? Yes If no, delete pharmacy and type the correct one.  This is the patient's preferred pharmacy:  [facility] [phone] - [place], [place] - [address] AT [facility] [address] [place] [address] Phone: [phone] Fax: [phone]   Has the prescription been filled recently? Yes  Is the patient out of the medication? Yes  Has the patient been seen for an appointment in the last year OR does the patient have an upcoming appointment? Yes  Can we respond through MyChart? Yes  Agent: Please be advised that Rx refills may take up to 3 business days. We ask that you follow-up with your pharmacy.

## 2023-06-20 ENCOUNTER — Other Ambulatory Visit: Payer: Self-pay

## 2023-06-20 ENCOUNTER — Telehealth: Payer: Self-pay | Admitting: Internal Medicine

## 2023-06-20 DIAGNOSIS — J4540 Moderate persistent asthma, uncomplicated: Secondary | ICD-10-CM

## 2023-06-20 MED ORDER — UNABLE TO FIND: 1.0000 | Freq: Every day | 0 refills | Status: DC

## 2023-06-20 NOTE — Telephone Encounter (Signed)
Sent nebulizer to pharmacy

## 2023-06-20 NOTE — Telephone Encounter (Signed)
Pharmacy called back instating they do not carry nebulizer         [facility] carry's the nebulizer [facility] does not

## 2023-06-20 NOTE — Telephone Encounter (Signed)
Patient called needs nebulizer.  Inhaler not til the 11.19.2024 due to insurance will not cover it. Having some trouble breathing saying needs a nebulizer.  [facility] [address] [place]

## 2023-06-21 ENCOUNTER — Ambulatory Visit: Payer: Self-pay | Admitting: Internal Medicine

## 2023-06-21 NOTE — Telephone Encounter (Signed)
  Chief Complaint: medication refill question  Additional Notes: Patient called in reporting that [name] ordered him a nebulizer machine yesterday to be sent to his local [facility]. Patient reports that when he spoke with [facility] today he was told that they do not carry the actual machines and that the order would have to be placed to pick up at a different pharmacy. Patient advised that he believes [facility] might carry the nebulizer machine he needs and would like to speak with someone about sending an order for the machine there.   Copied from CRM [id number]. Topic: Clinical - Prescription Issue >> Jun 21, 2023  4:29 PM Dustin Moore wrote: Reason for CRM: need nebulizer

## 2023-06-21 NOTE — Telephone Encounter (Signed)
Reason for Disposition . General information question, no triage required and triager able to answer question  Protocols used: Information Only Call - No Triage-A-AH

## 2023-06-22 NOTE — Telephone Encounter (Signed)
Rx sent to [facility]  ?

## 2023-07-12 ENCOUNTER — Ambulatory Visit: Payer: Medicaid Other | Admitting: Nutrition

## 2023-07-14 ENCOUNTER — Other Ambulatory Visit: Payer: Self-pay | Admitting: Nurse Practitioner

## 2023-07-14 NOTE — Telephone Encounter (Signed)
Needs to come from PCP

## 2023-07-16 ENCOUNTER — Other Ambulatory Visit: Payer: Self-pay | Admitting: Internal Medicine

## 2023-07-16 DIAGNOSIS — J449 Chronic obstructive pulmonary disease, unspecified: Secondary | ICD-10-CM

## 2023-07-17 ENCOUNTER — Other Ambulatory Visit: Payer: Self-pay | Admitting: Internal Medicine

## 2023-07-17 ENCOUNTER — Telehealth: Payer: Self-pay

## 2023-07-17 DIAGNOSIS — J449 Chronic obstructive pulmonary disease, unspecified: Secondary | ICD-10-CM

## 2023-07-17 NOTE — Telephone Encounter (Signed)
Hey, did you tell pt you would refill his Duloxetine?

## 2023-07-17 NOTE — Telephone Encounter (Signed)
I don't remember saying I would.  Usually I would do a temporary refill just until he gets to his PCP for follow up.  I am happy to do a 30 day supply if that helps but future refills would need to come through his PCP.

## 2023-07-17 NOTE — Telephone Encounter (Signed)
Left VM that pt needs to call his pcp

## 2023-07-18 ENCOUNTER — Other Ambulatory Visit: Payer: Self-pay | Admitting: Internal Medicine

## 2023-08-07 ENCOUNTER — Ambulatory Visit: Payer: Medicaid Other | Admitting: Internal Medicine

## 2023-08-07 ENCOUNTER — Ambulatory Visit: Payer: Self-pay | Admitting: Internal Medicine

## 2023-08-07 ENCOUNTER — Encounter: Payer: Self-pay | Admitting: Internal Medicine

## 2023-08-07 VITALS — BP 111/78 | HR 82 | Ht 70.0 in | Wt 226.6 lb

## 2023-08-07 DIAGNOSIS — I10 Essential (primary) hypertension: Secondary | ICD-10-CM

## 2023-08-07 DIAGNOSIS — M7989 Other specified soft tissue disorders: Secondary | ICD-10-CM

## 2023-08-07 MED ORDER — FUROSEMIDE 20 MG PO TABS: 20.0000 mg | ORAL_TABLET | Freq: Every day | ORAL | 0 refills | Status: DC | PRN

## 2023-08-07 NOTE — Patient Instructions (Addendum)
Please take Lasix as needed for leg swelling.  Perform leg elevation and compression socks as tolerated for leg swelling.

## 2023-08-07 NOTE — Telephone Encounter (Signed)
  Chief Complaint: BLE edema Symptoms: swelling below knees to feet, pain when trying to wear shoes Frequency: 1.5- 2 weeks Pertinent Negatives: Patient denies SOB, CP, weeping, redness Disposition: [] ED /[] Urgent Care (no appt availability in office) / [x] Appointment(In office/virtual)/ []  [facility]/ [] Home Care/ [] Refused Recommended Disposition /[] [place] [facility]/ []  Follow-up with PCP Additional Notes: Pt called stating he has BLE edema x 1.5-2 weeks. States that he has had this happen in the past, and it was relieved with elevation and rest, but this time will not subside. Pt reports it is painful when trying to put on shoes. Pt denies redness, weeping, SOB, CP. Per protocol, pt to be evaluated within 24 hours. Pt scheduled for today at 1340 with PCP. Care advice reviewed, pt verbalized understanding. Alerting PCP for review.   Copied from CRM [id number]. Topic: Clinical - Pink Word Triage >> Aug 07, 2023 12:56 PM Geneva B wrote: Reason for Triage: pt is calling due to having swelling in both feet Reason for Disposition  [1] MODERATE leg swelling (e.g., swelling extends up to knees) AND [2] new-onset or worsening  Answer Assessment - Initial Assessment Questions 1. ONSET: "When did the swelling start?" (e.g., minutes, hours, days)     Approx 1.5 weeks 2. LOCATION: "What part of the leg is swollen?"  "Are both legs swollen or just one leg?"     Knees down to toes, both legs 3. SEVERITY: "How bad is the swelling?" (e.g., localized; mild, moderate, severe)   - Localized: Small area of swelling localized to one leg.   - MILD pedal edema: Swelling limited to foot and ankle, pitting edema < 1/4 inch (6 mm) deep, rest and elevation eliminate most or all swelling.   - MODERATE edema: Swelling of lower leg to knee, pitting edema > 1/4 inch (6 mm) deep, rest and elevation only partially reduce swelling.   - SEVERE edema: Swelling extends above knee, facial or hand swelling  present.      Moderate, is not relieved with elevation 4. REDNESS: "Does the swelling look red or infected?"     Denies 5. PAIN: "Is the swelling painful to touch?" If Yes, ask: "How painful is it?"   (Scale 1-10; mild, moderate or severe)     Denies, unless trying to put on shoe. 6. FEVER: "Do you have a fever?" If Yes, ask: "What is it, how was it measured, and when did it start?"      Denies 7. CAUSE: "What do you think is causing the leg swelling?"     Fluid retention 8. MEDICAL HISTORY: "Do you have a history of blood clots (e.g., DVT), cancer, heart failure, kidney disease, or liver failure?"     Denies 9. RECURRENT SYMPTOM: "Have you had leg swelling before?" If Yes, ask: "When was the last time?" "What happened that time?"     Yes, this is the second time. Was relieved with rest and elevation. 10. OTHER SYMPTOMS: "Do you have any other symptoms?" (e.g., chest pain, difficulty breathing)       Denies  Protocols used: Leg Swelling and Edema-A-AH

## 2023-08-08 NOTE — Assessment & Plan Note (Signed)
 BP Readings from Last 1 Encounters:  08/07/23 111/78   Well-controlled with losartan -HCTZ 50-12.5 mg once daily Lasix  as needed for persistent swelling only Counseled for compliance with the medications Advised DASH diet and moderate exercise/walking, at least 150 mins/week

## 2023-08-08 NOTE — Assessment & Plan Note (Addendum)
Likely due to chronic venous insufficiency Advised to perform leg elevation and use compression socks as tolerated Advised to cut down salt intake Lasix as needed for persistent swelling

## 2023-08-10 ENCOUNTER — Ambulatory Visit: Payer: Self-pay | Admitting: Family Medicine

## 2023-08-15 ENCOUNTER — Other Ambulatory Visit: Payer: Self-pay | Admitting: Internal Medicine

## 2023-09-03 ENCOUNTER — Other Ambulatory Visit: Payer: Self-pay | Admitting: Internal Medicine

## 2023-09-03 DIAGNOSIS — M7989 Other specified soft tissue disorders: Secondary | ICD-10-CM

## 2023-09-04 ENCOUNTER — Other Ambulatory Visit: Payer: Self-pay | Admitting: Nurse Practitioner

## 2023-09-04 DIAGNOSIS — Z794 Long term (current) use of insulin: Secondary | ICD-10-CM

## 2023-09-04 DIAGNOSIS — Z7985 Long-term (current) use of injectable non-insulin antidiabetic drugs: Secondary | ICD-10-CM

## 2023-09-04 DIAGNOSIS — Z7984 Long term (current) use of oral hypoglycemic drugs: Secondary | ICD-10-CM

## 2023-09-04 DIAGNOSIS — E1165 Type 2 diabetes mellitus with hyperglycemia: Secondary | ICD-10-CM

## 2023-09-04 NOTE — Telephone Encounter (Signed)
Copied from CRM [id number]. Topic: General - Other >> Sep 04, 2023 12:42 PM Antony Haste wrote: Reason for CRM: Dustin Moore is requesting a callback from [name] or one of the nurses if possible, she did not specify the reason.  Callback #: [phone]

## 2023-09-06 ENCOUNTER — Telehealth: Payer: Self-pay | Admitting: Internal Medicine

## 2023-09-06 ENCOUNTER — Encounter: Payer: Self-pay | Admitting: Internal Medicine

## 2023-09-06 NOTE — Telephone Encounter (Signed)
Letter ready, tried calling pt to let him know , he hung up the phone 2x. Will try at a later time.

## 2023-09-06 NOTE — Telephone Encounter (Signed)
Copied from CRM [id number]. Topic: General - Call Back - No Documentation >> Sep 06, 2023  2:40 PM Dustin Moore wrote: Reason for CRM: Patient has received a couple calls from the office and he does not have good signal. 2:45pm would be a good time to call him back. If he gets signal he is also going to try and call back.

## 2023-09-06 NOTE — Telephone Encounter (Signed)
 Copied from CRM [id number]. Topic: General - Other >> Sep 06, 2023  9:33 AM Dimitri Ped wrote: Reason for CRM: patient is needing a doctor note . Been under the care of Dr patel and he has me on meds for my swelling and I go to work today and forgot to get a doctors note . Need this faxed to my job fax number [phone]. Been out before for my swelling and he gave me medicine and it helped for a week but its not working But he told me to stay off my feet through messages . Also think my job will send faxing papers so doctor can tell them once going on . They need to know if they should give me some limitations . 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 The job just wants to make sure they are covered if something happens and they want to know why is this is a reoccurring situation with patients feet and ankles being swollen . Missed like 4 days about this  and the job doesn't play about missing days . Make the note to crystal in hr

## 2023-09-07 ENCOUNTER — Telehealth: Payer: Self-pay | Admitting: Internal Medicine

## 2023-09-07 NOTE — Telephone Encounter (Signed)
FMLA work forms  Copied Noted Scanned to S drive Dr Allena Katz

## 2023-09-07 NOTE — Telephone Encounter (Signed)
I have emailed the work note.

## 2023-09-07 NOTE — Telephone Encounter (Signed)
Spoke to Mr. 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

## 2023-09-07 NOTE — Telephone Encounter (Signed)
Copied from CRM [id number]. 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 And will fax over some paperwork  [email or web address] email just in case you would like to send email

## 2023-09-07 NOTE — Telephone Encounter (Signed)
Second attempt

## 2023-09-08 ENCOUNTER — Telehealth: Payer: Self-pay | Admitting: Internal Medicine

## 2023-09-08 NOTE — Telephone Encounter (Unsigned)
Copied from CRM [id number]. 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 Can someone fax the letter to Attn: Crystal @ [phone] as soon as possible - You can leave a voicemail on his mobile phone to confirm.

## 2023-09-08 NOTE — Telephone Encounter (Signed)
Forms placed in provider's folder 

## 2023-09-08 NOTE — Telephone Encounter (Signed)
I do not see forms on the s drive completed still blank. See below.  Copied from CRM [id number]. Topic: General - Other >> Sep 08, 2023  8:37 AM Dustin Moore wrote: Reason for CRM: Patient's employer sent over some forms related to his work limitations and a release form - they received a fax back but it was only the release form - Can all of the forms be faxed back to them - Fax # [phone] Attn: Crystal.

## 2023-09-08 NOTE — Telephone Encounter (Signed)
Forms completed have been faxed to charges, faxed to [facility] [phone], sent to scan.

## 2023-09-08 NOTE — Telephone Encounter (Signed)
Spoke with Dustin Moore confirmed she received all the forms. Lvm for Markis Salmela let him know forms were faxed , I have a copy if needed. Originals were sent to scan.

## 2023-09-11 NOTE — Telephone Encounter (Signed)
Letter was faxed.

## 2023-09-12 ENCOUNTER — Other Ambulatory Visit: Payer: Self-pay | Admitting: Internal Medicine

## 2023-09-14 ENCOUNTER — Other Ambulatory Visit: Payer: Self-pay | Admitting: Internal Medicine

## 2023-09-14 DIAGNOSIS — E782 Mixed hyperlipidemia: Secondary | ICD-10-CM

## 2023-09-15 ENCOUNTER — Other Ambulatory Visit: Payer: Self-pay | Admitting: Internal Medicine

## 2023-09-15 DIAGNOSIS — M7989 Other specified soft tissue disorders: Secondary | ICD-10-CM

## 2023-09-15 NOTE — Telephone Encounter (Signed)
 Copied from CRM [id number]. Topic: Clinical - Medication Refill >> Sep 15, 2023 10:29 AM Zebedee SAUNDERS wrote: Most Recent Primary Care Visit:  Provider: TOBIE SUZZANE POUR  Department: [facility]  Visit Type: ACUTE  Date: 08/07/2023  Medication: furosemide  (LASIX ) 20 MG tablet  Has the patient contacted their pharmacy? Yes (Agent: If no, request that the patient contact the pharmacy for the refill. If patient does not wish to contact the pharmacy document the reason why and proceed with request.) (Agent: If yes, when and what did the pharmacy advise?)  Is this the correct pharmacy for this prescription? Yes If no, delete pharmacy and type the correct one.  This is the patient's preferred pharmacy:  [facility] [phone] - [place],  - [address] AT [facility] OF [address] [place] [address] Phone: [phone] Fax: [phone]   Has the prescription been filled recently? Yes  Is the patient out of the medication? Yes  Has the patient been seen for an appointment in the last year OR does the patient have an upcoming appointment? Yes  Can we respond through MyChart? Yes  Agent: Please be advised that Rx refills may take up to 3 business days. We ask that you follow-up with your pharmacy.

## 2023-09-18 NOTE — Telephone Encounter (Signed)
 Copied from CRM [id number]. Topic: Clinical - Medication Refill >> Sep 15, 2023 10:27 AM Felizardo Hotter wrote: Most Recent Primary Care Visit:  Provider: Meldon Sport  Department: [facility]  Visit Type: ACUTE  Date: 08/07/2023  Medication: furosemide  (LASIX ) 20 MG tablet  Has the patient contacted their pharmacy? Yes (Agent: If no, request that the patient contact the pharmacy for the refill. If patient does not wish to contact the pharmacy document the reason why and proceed with request.) (Agent: If yes, when and what did the pharmacy advise?)  Is this the correct pharmacy for this prescription? Yes If no, delete pharmacy and type the correct one.  This is the patient's preferred pharmacy:  [facility] [phone] - [place], [place] - [address] AT [facility] [address] [place] [address] Phone: [phone] Fax: [phone]   Has the prescription been filled recently? Yes  Is the patient out of the medication? Yes  Has the patient been seen for an appointment in the last year OR does the patient have an upcoming appointment? Yes  Can we respond through MyChart? Yes  Agent: Please be advised that Rx refills may take up to 3 business days. We ask that you follow-up with your pharmacy.

## 2023-09-20 ENCOUNTER — Ambulatory Visit: Payer: Medicaid Other | Admitting: Nurse Practitioner

## 2023-09-25 ENCOUNTER — Ambulatory Visit: Payer: Self-pay | Admitting: Internal Medicine

## 2023-09-25 ENCOUNTER — Other Ambulatory Visit: Payer: Self-pay | Admitting: Internal Medicine

## 2023-09-25 DIAGNOSIS — M7989 Other specified soft tissue disorders: Secondary | ICD-10-CM

## 2023-09-25 MED ORDER — FUROSEMIDE 40 MG PO TABS: 40.0000 mg | ORAL_TABLET | Freq: Every day | ORAL | 3 refills | Status: DC

## 2023-09-25 NOTE — Telephone Encounter (Signed)
 Pt informed

## 2023-09-25 NOTE — Telephone Encounter (Signed)
Information obtained from [place], [place]. Please call her with update about new rx, [phone]  Chief Complaint: bilateral ankle swelling Symptoms: swelling Frequency: 3 days  Pertinent Negatives: Patient denies fever, SOB, difficulty breathing  Disposition: [] ED /[] Urgent Care (no appt availability in office) / [] Appointment(In office/virtual)/ []  [place] [facility]/ [] Home Care/ [] Refused Recommended Disposition /[] [place] [facility]/ [x]  Follow-up with PCP  Additional Notes: Pt recently advised to double dose of lasix d/t ankle swelling. Pt started taking lasix as advised by PCP, but has no run out of lasix, attempts were made last week to obtain more lasix or a new rx as the 2 tabs a day has used what pt had left of the current bottle. Dr Allena Katz had advised the pt to double his dose from 20mg  to 40mg  and this was working per the pt, however, at this time they are completely out and requesting more medication. Denies SOB, difficulty breathing, fever.   Copied from CRM [id number]. Topic: Clinical - Red Word Triage >> Sep 25, 2023  8:26 AM Ivette P wrote: Red Word that prompted transfer to Nurse Triage: Swelled up ankles waiting on medications, still have not received. Reason for Disposition  Ankle swelling is a chronic symptom (recurrent or ongoing AND present > 4 weeks)  Answer Assessment - Initial Assessment Questions 1. LOCATION: "Which ankle is swollen?" "Where is the swelling?"     Both ankles 2. ONSET: "When did the swelling start?"     3 days ago 3. SWELLING: "How bad is the swelling?" Or, "How large is it?" (e.g., mild, moderate, severe; size of localized swelling)    - NONE: No joint swelling.   - LOCALIZED: Localized; small area of puffy or swollen skin (e.g., insect bite, skin irritation).   - MILD: Joint looks or feels mildly swollen or puffy.   - MODERATE: Swollen; interferes with normal activities (e.g., work or school); decreased range of movement; may be limping.    - SEVERE: Very swollen; can't move swollen joint at all; limping a lot or unable to walk.     With the medication it is okay, but without medication the swelling is very obvious 4. PAIN: "Is there any pain?" If Yes, ask: "How bad is it?" (Scale 1-10; or mild, moderate, severe)   - NONE (0): no pain.   - MILD (1-3): doesn't interfere with normal activities.    - MODERATE (4-7): interferes with normal activities (e.g., work or school) or awakens from sleep, limping.    - SEVERE (8-10): excruciating pain, unable to do any normal activities, unable to walk.      denies 5. CAUSE: "What do you think caused the ankle swelling?"     fluid 6. OTHER SYMPTOMS: "Do you have any other symptoms?" (e.g., fever, chest pain, difficulty breathing, calf pain)     Denies SOB, denies difficulty breathing, denies fever  Protocols used: Ankle Swelling-A-AH

## 2023-09-26 ENCOUNTER — Telehealth: Payer: Self-pay | Admitting: *Deleted

## 2023-09-26 LAB — COLOGUARD

## 2023-09-26 NOTE — Telephone Encounter (Signed)
[name] called this morning and left a message about the patient's recent appointment and A!C, as they are wanting to get his surgery posted. Patient was last seen in the office on 06/16/2023. There was a follow up for 09/20/2023 that was canceled. Patient currently has appointment for 09/28/2023.  I called and shared this with [name]. She ask that we flag chart  optimal so that when the patient come in we can fax to her at [phone] his updated information.

## 2023-09-26 NOTE — Telephone Encounter (Signed)
I have flagged his chart (sticky note) as a reminder to send updated OV note to them.

## 2023-09-27 ENCOUNTER — Other Ambulatory Visit: Payer: Self-pay | Admitting: Internal Medicine

## 2023-09-27 DIAGNOSIS — J4540 Moderate persistent asthma, uncomplicated: Secondary | ICD-10-CM

## 2023-09-28 ENCOUNTER — Other Ambulatory Visit (HOSPITAL_COMMUNITY): Payer: Self-pay

## 2023-09-28 ENCOUNTER — Ambulatory Visit: Payer: Medicaid Other | Admitting: Nurse Practitioner

## 2023-10-02 ENCOUNTER — Other Ambulatory Visit (HOSPITAL_COMMUNITY): Payer: Self-pay

## 2023-10-02 ENCOUNTER — Telehealth: Payer: Self-pay

## 2023-10-02 DIAGNOSIS — Z1211 Encounter for screening for malignant neoplasm of colon: Secondary | ICD-10-CM | POA: Diagnosis not present

## 2023-10-02 NOTE — Telephone Encounter (Signed)
 Pharmacy Patient Advocate Encounter   Received notification from CoverMyMeds that prior authorization for Dexcom G7 sensor is required/requested.   Insurance verification completed.   The patient is insured through [facility] .   Per test claim: PA required; PA submitted to above mentioned insurance via CoverMyMeds Key/confirmation #/EOC [id number] Status is pending

## 2023-10-03 NOTE — Telephone Encounter (Signed)
 Pharmacy Patient Advocate Encounter  Received notification from [facility] that Prior Authorization for Dexcom G7 sensor has been APPROVED through 03/29/24   PA #/Case ID/Reference #: [id number]

## 2023-10-04 NOTE — Telephone Encounter (Signed)
 Patient was called and made aware.

## 2023-10-06 ENCOUNTER — Other Ambulatory Visit: Payer: Self-pay | Admitting: Internal Medicine

## 2023-10-06 NOTE — Telephone Encounter (Signed)
 Copied from CRM [id number]. Topic: Clinical - Medication Refill >> Oct 06, 2023  9:36 AM Hamdi H wrote: Most Recent Primary Care Visit:  Provider: Anabel Halon  Department: [facility]  Visit Type: ACUTE  Date: 08/07/2023  Medication: losartan-hydrochlorothiazide (HYZAAR) 50-12.5 MG tablet  Has the patient contacted their pharmacy? Yes (Agent: If no, request that the patient contact the pharmacy for the refill. If patient does not wish to contact the pharmacy document the reason why and proceed with request.) (Agent: If yes, when and what did the pharmacy advise?)  Is this the correct pharmacy for this prescription? Yes If no, delete pharmacy and type the correct one.  This is the patient's preferred pharmacy:  [facility] [phone] - [place], [place] - [address] AT [facility] [address] [place] [address] Phone: [phone] Fax: [phone]   Has the prescription been filled recently? No  Is the patient out of the medication? Yes  Has the patient been seen for an appointment in the last year OR does the patient have an upcoming appointment? Yes  Can we respond through MyChart? No  Agent: Please be advised that Rx refills may take up to 3 business days. We ask that you follow-up with your pharmacy.

## 2023-10-09 LAB — COLOGUARD: COLOGUARD: NEGATIVE

## 2023-10-10 ENCOUNTER — Other Ambulatory Visit: Payer: Self-pay | Admitting: Internal Medicine

## 2023-10-11 ENCOUNTER — Ambulatory Visit: Payer: Medicaid Other | Admitting: Internal Medicine

## 2023-10-12 ENCOUNTER — Ambulatory Visit: Payer: Medicaid Other | Admitting: Nurse Practitioner

## 2023-10-12 ENCOUNTER — Encounter: Payer: Self-pay | Admitting: Nurse Practitioner

## 2023-10-12 VITALS — BP 108/60 | HR 79 | Ht 70.0 in | Wt 224.4 lb

## 2023-10-12 DIAGNOSIS — Z7985 Long-term (current) use of injectable non-insulin antidiabetic drugs: Secondary | ICD-10-CM

## 2023-10-12 DIAGNOSIS — E559 Vitamin D deficiency, unspecified: Secondary | ICD-10-CM

## 2023-10-12 DIAGNOSIS — Z7984 Long term (current) use of oral hypoglycemic drugs: Secondary | ICD-10-CM | POA: Diagnosis not present

## 2023-10-12 DIAGNOSIS — Z794 Long term (current) use of insulin: Secondary | ICD-10-CM

## 2023-10-12 DIAGNOSIS — I10 Essential (primary) hypertension: Secondary | ICD-10-CM

## 2023-10-12 DIAGNOSIS — E1165 Type 2 diabetes mellitus with hyperglycemia: Secondary | ICD-10-CM

## 2023-10-12 DIAGNOSIS — E782 Mixed hyperlipidemia: Secondary | ICD-10-CM

## 2023-10-12 LAB — POCT GLYCOSYLATED HEMOGLOBIN (HGB A1C): Hemoglobin A1C: 7.7 % — AB (ref 4.0–5.6)

## 2023-10-12 MED ORDER — METFORMIN HCL 500 MG PO TABS: 500.0000 mg | ORAL_TABLET | Freq: Two times a day (BID) | ORAL | 1 refills | Status: DC

## 2023-10-12 MED ORDER — BASAGLAR KWIKPEN 100 UNIT/ML ~~LOC~~ SOPN: 25.0000 [IU] | PEN_INJECTOR | Freq: Every day | SUBCUTANEOUS | Status: DC

## 2023-10-12 MED ORDER — DEXCOM G7 SENSOR MISC: 1.0000 | 3 refills | Status: DC

## 2023-10-12 NOTE — Progress Notes (Signed)
 Endocrinology Follow Up Note       10/12/2023, 4:37 PM   Subjective:    Patient ID: Dustin Moore, male    DOB: [birth year].  Dustin Moore is being seen in follow up after being seen in consultation for management of currently uncontrolled symptomatic diabetes requested by  Anabel Halon, MD.   Past Medical History:  Diagnosis Date   Asthma    Diabetes mellitus without complication (HCC)    Hypertension    Rotator cuff disorder, left     Past Surgical History:  Procedure Laterality Date   ROTATOR CUFF REPAIR Left 08/2021   TONSILLECTOMY AND ADENOIDECTOMY     TOTAL HIP ARTHROPLASTY Left 2013    Social History   Socioeconomic History   Marital status: Single    Spouse name: Not on file   Number of children: Not on file   Years of education: Not on file   Highest education level: Not on file  Occupational History   Not on file  Tobacco Use   Smoking status: Every Day    Current packs/day: 0.25    Average packs/day: 0.3 packs/day for 39.0 years (9.8 ttl pk-yrs)    Types: Cigarettes   Smokeless tobacco: Never  Vaping Use   Vaping status: Never Used  Substance and Sexual Activity   Alcohol use: Not Currently    Comment: occasinally on weekends    Drug use: Not Currently    Types: Marijuana, Cocaine    Comment: last percocet use 01/2022 (no MJ or cocaine since he was in his 25s)   Sexual activity: Not on file  Other Topics Concern   Not on file  Social History Narrative   Not on file   Social Drivers of Health   Financial Resource Strain: Not on File (03/28/2018)   Received from [employer], [employer]    Financial Resource Strain: 0  Food Insecurity: Not on File (05/04/2023)   Received from [facility] Insecurity    Food: 0  Transportation Needs: Not on File (03/28/2018)   Received from [employer] [facility], [employer] Needs    Transportation: 0  Physical Activity: Not on  File (03/28/2018)   Received from [place], [place]   Physical Activity    Physical Activity: 0  Stress: Not on File (03/28/2018)   Received from [facility], [place]   Stress    Stress: 0  Social Connections: Not on File (04/27/2023)   Received from [facility]   Social Connections    Connectedness: 0    Family History  Problem Relation Age of Onset   Diabetes Mother    Cirrhosis Mother    Heart disease Father    Heart attack Father    Heart disease Paternal Uncle     Outpatient Encounter Medications as of 10/12/2023  Medication Sig   aspirin EC 81 MG tablet Take 81 mg by mouth daily.   B Complex-C (B-COMPLEX WITH VITAMIN C) tablet Take 1 tablet by mouth daily.   budesonide-formoterol (SYMBICORT) 160-4.5 MCG/ACT inhaler Inhale 2 puffs into the lungs in the morning and at bedtime.   DULoxetine (CYMBALTA) 60 MG capsule TAKE 1 CAPSULE BY MOUTH DAILY  furosemide (LASIX) 40 MG tablet Take 1 tablet (40 mg total) by mouth daily.   insulin lispro (HUMALOG KWIKPEN) 100 UNIT/ML KwikPen Inject 8-14 Units into the skin 3 (three) times daily.   Insulin Pen Needle (PEN NEEDLES) 31G X 8 MM MISC Use to inject insulin 4 times daily   ipratropium-albuterol (DUONEB) 0.5-2.5 (3) MG/3ML SOLN USE 1 VIAL VIA NEBULIZER EVERY 4 HOURS AS NEEDED FOR WHEEZING OR SHORTNESS OF BREATH.   losartan-hydrochlorothiazide (HYZAAR) 50-12.5 MG tablet TAKE 1 TABLET BY MOUTH DAILY   MAGNESIUM PO Take 500 mg by mouth daily.   Omega 3-6-9 Fatty Acids (TRIPLE OMEGA COMPLEX PO) Take 1 capsule by mouth daily.   Potassium 99 MG TABS Take 1 tablet by mouth daily as needed (for cramps).   rosuvastatin (CRESTOR) 5 MG tablet TAKE 1 TABLET(5 MG) BY MOUTH EVERY EVENING   UNABLE TO FIND 1 each by Does not apply route daily. Med Name: NEBULIZER   VENTOLIN HFA 108 (90 Base) MCG/ACT inhaler INHALE 1 TO 2 PUFFS INTO THE LUNGS EVERY 6 HOURS AS NEEDED FOR WHEEZING OR SHORTNESS OF BREATH   [DISCONTINUED] Continuous Glucose Sensor (DEXCOM G7 SENSOR) MISC  Inject 1 Application into the skin as directed. Change sensor every 10 days as directed.   [DISCONTINUED] Insulin Glargine (BASAGLAR KWIKPEN) 100 UNIT/ML Inject 30 Units into the skin at bedtime.   [DISCONTINUED] metFORMIN (GLUCOPHAGE) 500 MG tablet TAKE 1 TABLET(500 MG) BY MOUTH TWICE DAILY WITH A MEAL   Continuous Glucose Sensor (DEXCOM G7 SENSOR) MISC Inject 1 Application into the skin as directed. Change sensor every 10 days as directed.   Insulin Glargine (BASAGLAR KWIKPEN) 100 UNIT/ML Inject 25 Units into the skin at bedtime.   metFORMIN (GLUCOPHAGE) 500 MG tablet Take 1 tablet (500 mg total) by mouth 2 (two) times daily with a meal.   No facility-administered encounter medications on file as of 10/12/2023.    ALLERGIES: Allergies  Allergen Reactions   Contrast Media [Iodinated Contrast Media] Anaphylaxis    VACCINATION STATUS: Immunization History  Administered Date(s) Administered   Fluzone Influenza virus vaccine,trivalent (IIV3), split virus 05/01/2013, 07/01/2015, 07/13/2017, 07/02/2018   Influenza, Seasonal, Injecte, Preservative Fre 06/13/2023   Influenza,inj,Quad PF,6+ Mos 07/08/2020   PFIZER(Purple Top)SARS-COV-2 Vaccination 11/14/2019, 12/06/2019   Zoster Recombinant(Shingrix) 06/13/2023    Diabetes He presents for his follow-up diabetic visit. He has type 2 diabetes mellitus. Onset time: diagnosed at approx age of 48. His disease course has been improving. Hypoglycemia symptoms include nervousness/anxiousness, sweats and tremors. Associated symptoms include blurred vision, fatigue, foot paresthesias, polydipsia and polyuria. There are no hypoglycemic complications. Symptoms are improving. 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 Risk factors for coronary artery disease include diabetes mellitus, dyslipidemia, family history, obesity, male sex, hypertension, sedentary lifestyle and tobacco exposure. Current diabetic treatment includes intensive  insulin program and oral agent (monotherapy). He is compliant with treatment most of the time. His weight is fluctuating minimally. He is following a generally unhealthy diet. When asked about meal planning, he reported none. He has not had a previous visit with a dietitian. He participates in exercise intermittently. His home blood glucose trend is decreasing steadily. His overall blood glucose range is >200 mg/dl. (He presents today, accompanied by his wife, with his CGM showing greatly improved glycemic profile.  His POCT A1c today is 7.7%, improving from last visit of 8.2%.  Analysis of his CGM shows TIR 58%, TAR 41%, TBR 1% with a GMI of 7.5%.  He does report getting woken up  by his CGM on several occasions for low readings, for which he drinks a glass of OJ.  He is waiting on clearance to have oral surgery.) An ACE inhibitor/angiotensin II receptor blocker is being taken. He does not see a podiatrist.Eye exam is not current.     Review of systems  Constitutional: + Minimally fluctuating body weight, current Body mass index is 32.2 kg/m., no fatigue, no subjective hyperthermia, no subjective hypothermia, poor dental health (needing oral surgery to removed affected teeth and get dentures) Eyes: no blurry vision, no xerophthalmia ENT: no sore throat, no nodules palpated in throat, no dysphagia/odynophagia, no hoarseness Cardiovascular: no chest pain, no shortness of breath, no palpitations, no leg swelling Respiratory: no cough, no shortness of breath Gastrointestinal: no nausea/vomiting/diarrhea Musculoskeletal: no muscle/joint aches Skin: no rashes, no hyperemia Neurological: no tremors, no numbness, no tingling, no dizziness Psychiatric: no depression, no anxiety  Objective:     BP 108/60 (BP Location: Right Arm, Patient Position: Sitting, Cuff Size: Large)   Pulse 79   Ht 5\' 10"  (1.778 m)   Wt 224 lb 6.4 oz (101.8 kg)   BMI 32.20 kg/m   Wt Readings from Last 3 Encounters:  10/12/23  224 lb 6.4 oz (101.8 kg)  08/07/23 226 lb 9.6 oz (102.8 kg)  06/16/23 227 lb 6.4 oz (103.1 kg)     BP Readings from Last 3 Encounters:  10/12/23 108/60  08/07/23 111/78  06/16/23 119/72      Physical Exam- Limited  Constitutional:  Body mass index is 32.2 kg/m. , not in acute distress, normal state of mind Eyes:  EOMI, no exophthalmos Musculoskeletal: no gross deformities, strength intact in all four extremities, no gross restriction of joint movements Skin:  no rashes, no hyperemia Neurological: no tremor with outstretched hands  Diabetic Foot Exam - Simple   No data filed      CMP ( most recent) CMP     Component Value Date/Time   NA 137 06/13/2023 0918   K 4.4 06/13/2023 0918   CL 102 06/13/2023 0918   CO2 20 06/13/2023 0918   GLUCOSE 177 (H) 06/13/2023 0918   BUN 22 06/13/2023 0918   CREATININE 1.18 06/13/2023 0918   CALCIUM 9.3 06/13/2023 0918   PROT 7.0 06/13/2023 0918   ALBUMIN 4.2 06/13/2023 0918   AST 37 06/13/2023 0918   ALT 30 06/13/2023 0918   ALKPHOS 115 06/13/2023 0918   BILITOT 0.3 06/13/2023 0918   EGFR 71 06/13/2023 0918     Diabetic Labs (most recent): Lab Results  Component Value Date   HGBA1C 7.7 (A) 10/12/2023   HGBA1C 8.2 (A) 06/16/2023   HGBA1C 9.6 (A) 03/14/2023     Lipid Panel ( most recent) Lipid Panel     Component Value Date/Time   CHOL 155 06/13/2023 0918   TRIG 139 06/13/2023 0918   HDL 44 06/13/2023 0918   CHOLHDL 3.5 06/13/2023 0918   LDLCALC 86 06/13/2023 0918   LABVLDL 25 06/13/2023 0918      Lab Results  Component Value Date   TSH 1.820 06/13/2023           Assessment & Plan:   1) Type 2 diabetes mellitus with hyperglycemia, with long-term current use of insulin (HCC)  He presents today, accompanied by his wife, with his CGM showing greatly improved glycemic profile.  His POCT A1c today is 7.7%, improving from last visit of 8.2%.  Analysis of his CGM shows TIR 58%, TAR 41%, TBR 1% with a  GMI of 7.5%.   He does report getting woken up by his CGM on several occasions for low readings, for which he drinks a glass of OJ.  He is waiting on clearance to have oral surgery.  - Dustin Moore has currently uncontrolled symptomatic type 2 DM since 63 years of age.   -Recent labs reviewed.  - I had a long discussion with him about the progressive nature of diabetes and the pathology behind its complications. -his diabetes is complicated by neuropathy and mild CKD and he remains at a high risk for more acute and chronic complications which include CAD, CVA, CKD, retinopathy, and neuropathy. These are all discussed in detail with him.  The following Lifestyle Medicine recommendations according to American College of Lifestyle Medicine [facility]) were discussed and offered to patient and he agrees to start the journey:  A. Whole Foods, Plant-based plate comprising of fruits and vegetables, plant-based proteins, whole-grain carbohydrates was discussed in detail with the patient.   A list for source of those nutrients were also provided to the patient.  Patient will use only water or unsweetened tea for hydration. B.  The need to stay away from risky substances including alcohol, smoking; obtaining 7 to 9 hours of restorative sleep, at least 150 minutes of moderate intensity exercise weekly, the importance of healthy social connections,  and stress reduction techniques were discussed. C.  A full color page of  Calorie density of various food groups per pound showing examples of each food groups was provided to the patient.  - Nutritional counseling repeated at each appointment due to patients tendency to fall back in to old habits.  - The patient admits there is a room for improvement in their diet and drink choices. -  Suggestion is made for the patient to avoid simple carbohydrates from their diet including Cakes, Sweet Desserts / Pastries, Ice Cream, Soda (diet and regular), Sweet Tea, Candies, Chips, Cookies, Sweet  Pastries, Store Bought Juices, Alcohol in Excess of 1-2 drinks a day, Artificial Sweeteners, Coffee Creamer, and "Sugar-free" Products. This will help patient to have stable blood glucose profile and potentially avoid unintended weight gain.   - I encouraged the patient to switch to unprocessed or minimally processed complex starch and increased protein intake (animal or plant source), fruits, and vegetables.   - Patient is advised to stick to a routine mealtimes to eat 3 meals a day and avoid unnecessary snacks (to snack only to correct hypoglycemia).  - he will be scheduled with Norm Salt, RDN, CDE for diabetes education.  - I have approached him with the following individualized plan to manage his diabetes and patient agrees:   -He is advised to lower his Basaglar slightly to 25 units SQ nightly and continue his Humalog to 8-14 units TID with meals if glucose is above 90 and he is eating (Specific instructions on how to titrate insulin dosage based on glucose readings given to patient in writing). He can also continue his Metformin 500 mg po twice daily after meals.  -Given his dramatic improvement, he appears ready to have his oral surgery from a diabetes standpoint.  -he is encouraged to continue monitoring glucose 4 times daily (using his CGM), before meals and before bed, and to call the clinic if he has readings less than 70 or above 300 for 3 tests in a row.    - he is warned not to take insulin without proper monitoring per orders. - Adjustment parameters are given to him for  hypo and hyperglycemia in writing. - he is encouraged to call clinic for blood glucose levels less than 70 or above 300 mg /dl.  -He did not tolerate GLP1 in the past (he is high risk for development of pancreatitis on such medication given heavy smoking history).  - Specific targets for  A1c; LDL, HDL, and Triglycerides were discussed with the patient.  2) Blood Pressure /Hypertension:  his blood  pressure is controlled to target.   he is advised to continue his current medications including Losartan 50 mg po daily and hydrochlorothiazide 12.5 mg p.o. daily with breakfast.  3) Lipids/Hyperlipidemia:    Review of his recent lipid panel from 06/13/23 showed controlled LDL at 86. He is currently on Crestor 5 mg po daily.  4)  Weight/Diet:  his Body mass index is 32.2 kg/m.  -  clearly complicating his diabetes care.   he is a candidate for weight loss. I discussed with him the fact that loss of 5 - 10% of his  current body weight will have the most impact on his diabetes management.  Exercise, and detailed carbohydrates information provided  -  detailed on discharge instructions.  5) Chronic Care/Health Maintenance: -he is on ACEI/ARB and not on Statin medications and is encouraged to initiate and continue to follow up with Ophthalmology, Dentist, Podiatrist at least yearly or according to recommendations, and advised to QUIT SMOKING. I have recommended yearly flu vaccine and pneumonia vaccine at least every 5 years; moderate intensity exercise for up to 150 minutes weekly; and sleep for at least 7 hours a day.  - he is advised to maintain close follow up with Anabel Halon, MD for primary care needs, as well as his other providers for optimal and coordinated care.  -I do feel he is medically cleared from an endocrine standpoint to have his oral surgery.  Will fax office visit note to his oral surgeon.     I spent  20  minutes in the care of the patient today including review of labs from CMP, Lipids, Thyroid Function, Hematology (current and previous including abstractions from other facilities); face-to-face time discussing  his blood glucose readings/logs, discussing hypoglycemia and hyperglycemia episodes and symptoms, medications doses, his options of short and long term treatment based on the latest standards of care / guidelines;  discussion about incorporating lifestyle medicine;  and  documenting the encounter. Risk reduction counseling performed per USPSTF guidelines to reduce obesity and cardiovascular risk factors.     Please refer to Patient Instructions for Blood Glucose Monitoring and Insulin/Medications Dosing Guide"  in media tab for additional information. Please  also refer to " Patient Self Inventory" in the Media  tab for reviewed elements of pertinent patient history.  Dustin Moore participated in the discussions, expressed understanding, and voiced agreement with the above plans.  All questions were answered to his satisfaction. he is encouraged to contact clinic should he have any questions or concerns prior to his return visit.     Follow up plan: - Return in about 3 months (around 01/12/2024) for Diabetes F/U with A1c in office, No previsit labs, Bring meter and logs.   Ronny Bacon, [facility] [address] [place], [place] [address] Phone: [phone] Fax: [phone]  10/12/2023, 4:37 PM

## 2023-10-18 ENCOUNTER — Other Ambulatory Visit (HOSPITAL_COMMUNITY): Payer: Self-pay | Admitting: Physician Assistant

## 2023-10-18 ENCOUNTER — Ambulatory Visit (HOSPITAL_COMMUNITY)
Admission: RE | Admit: 2023-10-18 | Discharge: 2023-10-18 | Disposition: A | Discharge status: Home / Self Care | Source: Ambulatory Visit | Attending: Physician Assistant | Admitting: Physician Assistant

## 2023-10-18 ENCOUNTER — Ambulatory Visit: Payer: Self-pay | Admitting: Internal Medicine

## 2023-10-18 DIAGNOSIS — M79605 Pain in left leg: Secondary | ICD-10-CM | POA: Insufficient documentation

## 2023-10-18 NOTE — Telephone Encounter (Signed)
 Chief Complaint: Leg pain  Symptoms: Left leg pain, mild swelling, warm to touch,  Frequency: constant  Pertinent Negatives: Patient denies fever, chills, SOB, chest pain  Disposition: [] ED /[] Urgent Care (no appt availability in office) / [x] Appointment(In office/virtual)/ []  [facility]/ [] Home Care/ [] Refused Recommended Disposition /[] [facility]/ []  Follow-up with PCP Additional Notes: Patient states he worked 12 hours yesterday and his leg was swollen but this morning when he woke up the swelling had decreased but the pain was a 10/10 and the leg is warm to touch. Care advice was given and patient has been scheduled for an office visit with PCP tomorrow. Patient stated he will go to urgent care today if symptoms become unbearable.   Copied from CRM [id number]. Topic: Clinical - Red Word Triage >> Oct 18, 2023 11:18 AM Gery Pray wrote: Red Word that prompted transfer to Nurse Triage: [facility] called in regards to her fiance (patient) left ankle. She states that the ankle is red and warm to the touch. States that it swollen and sensitive to the touch with pain being a 10. Reason for Disposition  Localized pain, redness or hard lump along vein  Answer Assessment - Initial Assessment Questions 1. ONSET: "When did the pain start?"      Yesterday  2. LOCATION: "Where is the pain located?"      Left leg from the shin to the top of the foot  3. PAIN: "How bad is the pain?"    (Scale 1-10; or mild, moderate, severe)   -  MILD (1-3): doesn't interfere with normal activities    -  MODERATE (4-7): interferes with normal activities (e.g., work or school) or awakens from sleep, limping    -  SEVERE (8-10): excruciating pain, unable to do any normal activities, unable to walk     10/10 4. WORK OR EXERCISE: "Has there been any recent work or exercise that involved this part of the body?"      I been on my feet a lot at work  5. CAUSE: "What do you think is causing the leg pain?"      Unsure  6. OTHER SYMPTOMS: "Do you have any other symptoms?" (e.g., chest pain, back pain, breathing difficulty, swelling, rash, fever, numbness, weakness)     Red, tender to touch, warm to touch, difficult to walk  Protocols used: Leg Pain-A-AH

## 2023-10-19 ENCOUNTER — Ambulatory Visit: Payer: Self-pay | Admitting: Internal Medicine

## 2023-10-20 ENCOUNTER — Ambulatory Visit: Payer: Self-pay | Admitting: Internal Medicine

## 2023-10-20 NOTE — Telephone Encounter (Signed)
 Left ankle, foot, and leg swelling  Symptoms: Warm to touch, red rash, pain with touch (5/10) Frequency: "Ongoing since first of this week" Pertinent Negatives: Patient denies chest pain, difficulty breathing, fever  Disposition: [x] Urgent Care (no appt availability in office)   Additional Notes: Pt states he has left ankle swelling, redness, and it is warm to touch. Pt went to urgent care on Wednesday and got an ultrasound to rule out a blood clot. Pt did not have a blood clot. Pt advised to go to urgent care due to no appt availability in office. This RN educated pt on home care, new-worsening symptoms, when to call back/seek emergent care. Pt verbalized understanding and agrees to plan.    Copied from CRM [id number]. Topic: Clinical - Red Word Triage >> Oct 20, 2023 10:24 AM Marland Kitchen D wrote: Patient is having swelling of his ankles Reason for Disposition  [1] Redness AND [2] painful when touched AND [3] no fever  Answer Assessment - Initial Assessment Questions 1. LOCATION: "Which ankle is swollen?" "Where is the swelling?"     *No Answer* 2. ONSET: "When did the swelling start?"     *No Answer* 3. SWELLING: "How bad is the swelling?" Or, "How large is it?" (e.g., mild, moderate, severe; size of localized swelling)    - NONE: No joint swelling.   - LOCALIZED: Localized; small area of puffy or swollen skin (e.g., insect bite, skin irritation).   - MILD: Joint looks or feels mildly swollen or puffy.   - MODERATE: Swollen; interferes with normal activities (e.g., work or school); decreased range of movement; may be limping.   - SEVERE: Very swollen; can't move swollen joint at all; limping a lot or unable to walk.     Severe limping 4. PAIN: "Is there any pain?" If Yes, ask: "How bad is it?" (Scale 1-10; or mild, moderate, severe)   - NONE (0): no pain.   - MILD (1-3): doesn't interfere with normal activities.    - MODERATE (4-7): interferes with normal activities (e.g., work or school)  or awakens from sleep, limping.    - SEVERE (8-10): excruciating pain, unable to do any normal activities, unable to walk.      *No Answer* 5. CAUSE: "What do you think caused the ankle swelling?"     *No Answer* 6. OTHER SYMPTOMS: "Do you have any other symptoms?" (e.g., fever, chest pain, difficulty breathing, calf pain)     *No Answer* 7. PREGNANCY: "Is there any chance you are pregnant?" "When was your last menstrual period?"     *No Answer*  Protocols used: Ankle Swelling-A-AH

## 2023-10-31 ENCOUNTER — Other Ambulatory Visit: Payer: Self-pay | Admitting: Internal Medicine

## 2023-11-07 ENCOUNTER — Other Ambulatory Visit: Payer: Self-pay | Admitting: Internal Medicine

## 2023-11-21 ENCOUNTER — Ambulatory Visit: Payer: Self-pay | Admitting: Internal Medicine

## 2023-11-29 ENCOUNTER — Other Ambulatory Visit: Payer: Self-pay | Admitting: Internal Medicine

## 2023-12-03 ENCOUNTER — Other Ambulatory Visit: Payer: Self-pay | Admitting: Internal Medicine

## 2023-12-08 ENCOUNTER — Telehealth: Payer: Self-pay | Admitting: *Deleted

## 2023-12-08 NOTE — Telephone Encounter (Signed)
 Patient left a message asking if something could be called in for his Diabetic Neuropathy. Maybe something stronger or better than what he has had. His feet are really hurting bad.

## 2023-12-11 NOTE — Telephone Encounter (Signed)
 I recommend trying OTC Magnesium Glycinate 1-2 tabs before bed.  This can help with nerve inflammation and thus the pain.  I don't want to start anything heavier until he has made it through his oral surgery.

## 2023-12-12 ENCOUNTER — Other Ambulatory Visit: Payer: Self-pay | Admitting: Internal Medicine

## 2023-12-12 DIAGNOSIS — J4540 Moderate persistent asthma, uncomplicated: Secondary | ICD-10-CM

## 2023-12-12 NOTE — H&P (Signed)
   PatientDemitry Moore  PID: [id number]  DOB: 1961/01/20  SEX: Male   Patient referred by  DDS for extraction all remaining teeth  CC: Dental pain. Wants dentures. Has gotten HbA1c down to 7.  Past Medical History:  High Blood Pressure, Asthma, Smoker, Diabetes, Obese    Medications: Aspirin ec, B complex vitamin, Basaglar , Duloxetine, Ergocalciferol , Humalog , Hydrochlorothiazide, Losartan, Magnesium, Metformin , Potassium, Proventil , Symbicort , Trulicity    Allergies:     None    Surgeries:   shoulder surgery, hip replacement surgery         Social History       Smoking:            Alcohol: Drug use:                             Exam: BMI 31.  Multiple caries, abraded teeth #'s 2, 6, 7, 8, 9, 10, 11, 13, 15, 20, 21, 22, 23, 24, 25, 26, 27, 28, 29. 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 No purulence, edema, fluctuance, trismus. Oral cancer screening negative. Pharynx clear. No lymphadenopathy.  Panorex:Multiple caries, eroded and abraded teeth #'s 2, 6, 7, 8, 9, 10, 11, 13, 15, 20, 21, 22, 23, 24, 25, 26, 27, 28, 29.  Assessment: ASA 3.  Non-restorable teeth#   2, 6, 7, 8, 9, 10, 11, 13, 15, 20, 21, 22, 23, 24, 25, 26, 27, 28, 29.     Bilateral mandibular lingual tori.        Plan: Extraction Teeth #  2, 6, 7, 8, 9, 10, 11, 13, 15, 20, 21, 22, 23, 24, 25, 26, 27, 28, 29. Alveoloplasty. Removal bilateral mandibular lingual tori.      Hospital Day surgery.             Rx: none               Risks and complications explained. Questions answered.   Dustin Moore, DMD

## 2023-12-12 NOTE — Telephone Encounter (Signed)
 Patient was called and a message was left sharing Whitney's recommendation.

## 2023-12-13 ENCOUNTER — Other Ambulatory Visit: Payer: Self-pay

## 2023-12-13 ENCOUNTER — Encounter (HOSPITAL_COMMUNITY): Payer: Self-pay | Admitting: Oral Surgery

## 2023-12-13 NOTE — Progress Notes (Addendum)
 PCP - Dr Cleola Dach Cardiologist - none Endocrinology - Hulon Magic, NP  Chest x-ray - n/a EKG - DOS Stress Test - n/a ECHO - n/a Cardiac Cath - n/a  ICD Pacemaker/Loop - n/a  Sleep Study -  n/a  Diabetes Type 2 Dexcom System G7, Sensor on left arm Fasting Blood Sugar - 120s-130s Checks Blood Sugar - several times a day  Do not take Metformin  on the morning of surgery.  THE NIGHT BEFORE SURGERY, take 12 units of Insulin  Glargine.      THE MORNING OF SURGERY, do not take Humalog  Insulin  unless your CBG is greater than 220 mg/dL.  If CBG > 220 mg/dL, you may take  of your sliding scale (correction) dose of insulin .  If your blood sugar is less than 70 mg/dL, you will need to treat for low blood sugar: Treat a low blood sugar (less than 70 mg/dL) with  cup of clear juice (cranberry or apple), 4 glucose tablets, OR glucose gel. Recheck blood sugar in 15 minutes after treatment (to make sure it is greater than 70 mg/dL). If your blood sugar is not greater than 70 mg/dL on recheck, call [phone] for further instructions.  Blood Thinner Instructions:  n/a  Aspirin Instructions: Follow your surgeon's instructions on when to stop aspirin prior to surgery,  If no instructions were given by your surgeon then you will need to call the office for those instructions. Patient to call MD about ASA instructions.  NPO  Anesthesia review: no  STOP now taking any Aspirin (unless otherwise instructed by your surgeon), Aleve, Naproxen, Ibuprofen, Motrin, Advil, Goody's, BC's, all herbal medications, fish oil, and all vitamins.   Coronavirus Screening Do you have any of the following symptoms:  Cough yes/no: No Fever (>100.62F)  yes/no: No Runny nose yes/no: No Sore throat yes/no: No Difficulty breathing/shortness of breath  yes/no: No  Have you traveled in the last 14 days and where? yes/no: No  Patient and SO Bartholomew Light verbalized understanding of instructions that were given via  speaker phone.

## 2023-12-14 NOTE — Anesthesia Preprocedure Evaluation (Addendum)
 Anesthesia Evaluation  Patient identified by MRN, date of birth, ID band Patient awake    Reviewed: Allergy & Precautions, NPO status , Patient's Chart, lab work & pertinent test results  History of Anesthesia Complications Negative for: history of anesthetic complications  Airway Mallampati: III  TM Distance: >3 FB Neck ROM: Full   Comment: Previous grade II view with MAC 3, easy mask Dental  (+) Dental Advisory Given, Poor Dentition   Pulmonary asthma , COPD (used inhaler yesterday),  COPD inhaler, Current Smoker and Patient abstained from smoking.   Pulmonary exam normal breath sounds clear to auscultation       Cardiovascular hypertension (losartan-HCTZ), Pt. on medications (-) angina (-) Past MI, (-) Cardiac Stents and (-) CABG (-) dysrhythmias  Rhythm:Regular Rate:Normal  HLD   Neuro/Psych negative neurological ROS     GI/Hepatic negative GI ROS, Neg liver ROS,,,  Endo/Other  diabetes, Type 2, Insulin  Dependent, Oral Hypoglycemic Agents    Renal/GU negative Renal ROS     Musculoskeletal   Abdominal   Peds  Hematology  (+) Blood dyscrasia, anemia Lab Results      Component                Value               Date                      WBC                      6.1                 06/13/2023                HGB                      11.5 (L)            06/13/2023                HCT                      35.2 (L)            06/13/2023                MCV                      91                  06/13/2023                PLT                      156                 06/13/2023              Anesthesia Other Findings Had orange juice with pulp at 3:00 this morning  Reproductive/Obstetrics                             Anesthesia Physical Anesthesia Plan  ASA: 3  Anesthesia Plan: General   Post-op Pain Management: Tylenol PO (pre-op)*   Induction: Intravenous  PONV Risk Score and Plan: 1  and Ondansetron, Dexamethasone and Treatment may vary due to age or medical condition  Airway Management Planned: Nasal ETT and Video Laryngoscope Planned  Additional Equipment:   Intra-op Plan:   Post-operative Plan: Extubation in OR  Informed Consent: I have reviewed the patients History and Physical, chart, labs and discussed the procedure including the risks, benefits and alternatives for the proposed anesthesia with the patient or authorized representative who has indicated his/her understanding and acceptance.     Dental advisory given  Plan Discussed with: CRNA and Anesthesiologist  Anesthesia Plan Comments: (Risks of general anesthesia discussed including, but not limited to, sore throat, hoarse voice, chipped/damaged teeth, injury to vocal cords, nausea and vomiting, allergic reactions, lung infection, heart attack, stroke, and death. All questions answered. )       Anesthesia Quick Evaluation

## 2023-12-15 ENCOUNTER — Ambulatory Visit (HOSPITAL_COMMUNITY): Payer: Self-pay

## 2023-12-15 ENCOUNTER — Ambulatory Visit (HOSPITAL_COMMUNITY)
Admission: RE | Admit: 2023-12-15 | Discharge: 2023-12-15 | Disposition: A | Discharge status: Home / Self Care | Attending: Oral Surgery | Admitting: Oral Surgery

## 2023-12-15 ENCOUNTER — Encounter (HOSPITAL_COMMUNITY)
Admission: RE | Disposition: A | Discharge status: Home / Self Care | Payer: Self-pay | Source: Home / Self Care | Attending: Oral Surgery

## 2023-12-15 ENCOUNTER — Other Ambulatory Visit: Payer: Self-pay

## 2023-12-15 ENCOUNTER — Encounter (HOSPITAL_COMMUNITY): Payer: Self-pay | Admitting: Oral Surgery

## 2023-12-15 ENCOUNTER — Ambulatory Visit (HOSPITAL_BASED_OUTPATIENT_CLINIC_OR_DEPARTMENT_OTHER): Payer: Self-pay

## 2023-12-15 DIAGNOSIS — K029 Dental caries, unspecified: Secondary | ICD-10-CM

## 2023-12-15 DIAGNOSIS — J4489 Other specified chronic obstructive pulmonary disease: Secondary | ICD-10-CM | POA: Insufficient documentation

## 2023-12-15 DIAGNOSIS — E119 Type 2 diabetes mellitus without complications: Secondary | ICD-10-CM

## 2023-12-15 DIAGNOSIS — I10 Essential (primary) hypertension: Secondary | ICD-10-CM | POA: Diagnosis not present

## 2023-12-15 DIAGNOSIS — J449 Chronic obstructive pulmonary disease, unspecified: Secondary | ICD-10-CM | POA: Diagnosis not present

## 2023-12-15 DIAGNOSIS — Z79899 Other long term (current) drug therapy: Secondary | ICD-10-CM | POA: Insufficient documentation

## 2023-12-15 DIAGNOSIS — Z794 Long term (current) use of insulin: Secondary | ICD-10-CM | POA: Insufficient documentation

## 2023-12-15 DIAGNOSIS — M270 Developmental disorders of jaws: Secondary | ICD-10-CM | POA: Diagnosis not present

## 2023-12-15 DIAGNOSIS — Z7984 Long term (current) use of oral hypoglycemic drugs: Secondary | ICD-10-CM | POA: Diagnosis not present

## 2023-12-15 HISTORY — PX: TOOTH EXTRACTION: SHX859

## 2023-12-15 LAB — GLUCOSE, CAPILLARY
Glucose-Capillary: 139 mg/dL — ABNORMAL HIGH (ref 70–99)
Glucose-Capillary: 139 mg/dL — ABNORMAL HIGH (ref 70–99)

## 2023-12-15 SURGERY — DENTAL RESTORATION/EXTRACTIONS
Anesthesia: General | Site: Mouth

## 2023-12-15 MED ORDER — OXYCODONE HCL 5 MG PO TABS
5.0000 mg | ORAL_TABLET | Freq: Once | ORAL | Status: AC | PRN
  Administered 2023-12-15: 5 mg via ORAL

## 2023-12-15 MED ORDER — ONDANSETRON HCL 4 MG/2ML IJ SOLN
INTRAMUSCULAR | Status: AC
  Filled 2023-12-15: qty 2

## 2023-12-15 MED ORDER — DEXAMETHASONE SODIUM PHOSPHATE 10 MG/ML IJ SOLN
INTRAMUSCULAR | Status: DC | PRN
  Administered 2023-12-15: 10 mg via INTRAVENOUS

## 2023-12-15 MED ORDER — FENTANYL CITRATE (PF) 100 MCG/2ML IJ SOLN
INTRAMUSCULAR | Status: AC
  Filled 2023-12-15: qty 2

## 2023-12-15 MED ORDER — MIDAZOLAM HCL 2 MG/2ML IJ SOLN
INTRAMUSCULAR | Status: AC
  Filled 2023-12-15: qty 2

## 2023-12-15 MED ORDER — DEXAMETHASONE SODIUM PHOSPHATE 10 MG/ML IJ SOLN
INTRAMUSCULAR | Status: AC
  Filled 2023-12-15: qty 1

## 2023-12-15 MED ORDER — SUGAMMADEX SODIUM 200 MG/2ML IV SOLN
INTRAVENOUS | Status: DC | PRN
  Administered 2023-12-15: 200 mg via INTRAVENOUS

## 2023-12-15 MED ORDER — PROPOFOL 10 MG/ML IV BOLUS
INTRAVENOUS | Status: DC | PRN
  Administered 2023-12-15: 200 mg via INTRAVENOUS

## 2023-12-15 MED ORDER — OXYCODONE-ACETAMINOPHEN 5-325 MG PO TABS: 1.0000 | ORAL_TABLET | ORAL | 0 refills | Status: DC | PRN

## 2023-12-15 MED ORDER — OXYCODONE HCL 5 MG/5ML PO SOLN: 5.0000 mg | Freq: Once | ORAL | Status: AC | PRN

## 2023-12-15 MED ORDER — OXYCODONE HCL 5 MG PO TABS
ORAL_TABLET | ORAL | Status: AC
  Filled 2023-12-15: qty 1

## 2023-12-15 MED ORDER — OXYMETAZOLINE HCL 0.05 % NA SOLN
NASAL | Status: DC | PRN
  Administered 2023-12-15: 1 via TOPICAL

## 2023-12-15 MED ORDER — 0.9 % SODIUM CHLORIDE (POUR BTL) OPTIME
TOPICAL | Status: DC | PRN
  Administered 2023-12-15: 1000 mL

## 2023-12-15 MED ORDER — CEFAZOLIN SODIUM-DEXTROSE 2-4 GM/100ML-% IV SOLN
2.0000 g | INTRAVENOUS | Status: AC
  Administered 2023-12-15: 2 g via INTRAVENOUS
  Filled 2023-12-15: qty 100

## 2023-12-15 MED ORDER — AMOXICILLIN 500 MG PO CAPS: 500.0000 mg | ORAL_CAPSULE | Freq: Three times a day (TID) | ORAL | 0 refills | Status: DC

## 2023-12-15 MED ORDER — ONDANSETRON HCL 4 MG/2ML IJ SOLN
INTRAMUSCULAR | Status: DC | PRN
  Administered 2023-12-15: 4 mg via INTRAVENOUS

## 2023-12-15 MED ORDER — MIDAZOLAM HCL 2 MG/2ML IJ SOLN
INTRAMUSCULAR | Status: DC | PRN
  Administered 2023-12-15: 2 mg via INTRAVENOUS

## 2023-12-15 MED ORDER — ALBUTEROL SULFATE HFA 108 (90 BASE) MCG/ACT IN AERS
INHALATION_SPRAY | RESPIRATORY_TRACT | Status: DC | PRN
  Administered 2023-12-15: 10 via RESPIRATORY_TRACT

## 2023-12-15 MED ORDER — NICOTINE 14 MG/24HR TD PT24: 14.0000 mg | MEDICATED_PATCH | Freq: Every day | TRANSDERMAL | 0 refills | Status: DC

## 2023-12-15 MED ORDER — LIDOCAINE-EPINEPHRINE 2 %-1:100000 IJ SOLN
INTRAMUSCULAR | Status: AC
  Filled 2023-12-15: qty 1

## 2023-12-15 MED ORDER — ROCURONIUM BROMIDE 10 MG/ML (PF) SYRINGE
PREFILLED_SYRINGE | INTRAVENOUS | Status: AC
  Filled 2023-12-15: qty 10

## 2023-12-15 MED ORDER — HYDROMORPHONE HCL 1 MG/ML IJ SOLN
0.5000 mg | INTRAMUSCULAR | Status: AC | PRN
  Administered 2023-12-15 (×2): 0.5 mg via INTRAVENOUS

## 2023-12-15 MED ORDER — SODIUM CHLORIDE 0.9 % IR SOLN
Status: DC | PRN
  Administered 2023-12-15: 1000 mL

## 2023-12-15 MED ORDER — CHLORHEXIDINE GLUCONATE 0.12 % MT SOLN
15.0000 mL | OROMUCOSAL | Status: DC
  Filled 2023-12-15: qty 15

## 2023-12-15 MED ORDER — LIDOCAINE 2% (20 MG/ML) 5 ML SYRINGE
INTRAMUSCULAR | Status: AC
  Filled 2023-12-15: qty 5

## 2023-12-15 MED ORDER — AMISULPRIDE (ANTIEMETIC) 5 MG/2ML IV SOLN: 10.0000 mg | Freq: Once | INTRAVENOUS | Status: DC | PRN

## 2023-12-15 MED ORDER — LIDOCAINE-EPINEPHRINE 2 %-1:100000 IJ SOLN
INTRAMUSCULAR | Status: DC | PRN
  Administered 2023-12-15: 20 mL

## 2023-12-15 MED ORDER — ROCURONIUM BROMIDE 10 MG/ML (PF) SYRINGE
PREFILLED_SYRINGE | INTRAVENOUS | Status: DC | PRN
  Administered 2023-12-15: 70 mg via INTRAVENOUS

## 2023-12-15 MED ORDER — FENTANYL CITRATE (PF) 250 MCG/5ML IJ SOLN
INTRAMUSCULAR | Status: DC | PRN
  Administered 2023-12-15: 50 ug via INTRAVENOUS
  Administered 2023-12-15: 100 ug via INTRAVENOUS
  Administered 2023-12-15: 25 ug via INTRAVENOUS

## 2023-12-15 MED ORDER — ACETAMINOPHEN 500 MG PO TABS
1000.0000 mg | ORAL_TABLET | Freq: Once | ORAL | Status: DC
  Filled 2023-12-15: qty 2

## 2023-12-15 MED ORDER — FENTANYL CITRATE (PF) 250 MCG/5ML IJ SOLN
INTRAMUSCULAR | Status: AC
  Filled 2023-12-15: qty 5

## 2023-12-15 MED ORDER — LACTATED RINGERS IV SOLN: INTRAVENOUS | Status: DC

## 2023-12-15 MED ORDER — HYDROMORPHONE HCL 1 MG/ML IJ SOLN
INTRAMUSCULAR | Status: AC
  Filled 2023-12-15: qty 1

## 2023-12-15 MED ORDER — LIDOCAINE 2% (20 MG/ML) 5 ML SYRINGE
INTRAMUSCULAR | Status: DC | PRN
  Administered 2023-12-15: 100 mg via INTRAVENOUS

## 2023-12-15 MED ORDER — FENTANYL CITRATE (PF) 100 MCG/2ML IJ SOLN
25.0000 ug | INTRAMUSCULAR | Status: DC | PRN
  Administered 2023-12-15 (×3): 50 ug via INTRAVENOUS

## 2023-12-15 MED ORDER — PROPOFOL 10 MG/ML IV BOLUS
INTRAVENOUS | Status: AC
  Filled 2023-12-15: qty 20

## 2023-12-15 SURGICAL SUPPLY — 26 items
BAG COUNTER SPONGE SURGICOUNT (BAG) IMPLANT
BLADE SURG 15 STRL LF DISP TIS (BLADE) ×1 IMPLANT
BUR CROSS CUT FISSURE 1.6 (BURR) ×1 IMPLANT
BUR EGG ELITE 4.0 (BURR) IMPLANT
CANISTER SUCTION 3000ML PPV (SUCTIONS) ×1 IMPLANT
COVER SURGICAL LIGHT HANDLE (MISCELLANEOUS) ×1 IMPLANT
GAUZE PACKING FOLDED 2 STR (GAUZE/BANDAGES/DRESSINGS) ×1 IMPLANT
GLOVE BIO SURGEON STRL SZ8 (GLOVE) ×1 IMPLANT
GOWN STRL REUS W/ TWL LRG LVL3 (GOWN DISPOSABLE) ×1 IMPLANT
GOWN STRL REUS W/ TWL XL LVL3 (GOWN DISPOSABLE) ×1 IMPLANT
IV NS 1000ML BAXH (IV SOLUTION) ×1 IMPLANT
KIT BASIN OR (CUSTOM PROCEDURE TRAY) ×1 IMPLANT
KIT TURNOVER KIT B (KITS) ×1 IMPLANT
NDL HYPO 25GX1X1/2 BEV (NEEDLE) ×2 IMPLANT
NEEDLE HYPO 25GX1X1/2 BEV (NEEDLE) ×2 IMPLANT
NS IRRIG 1000ML POUR BTL (IV SOLUTION) ×1 IMPLANT
PAD ARMBOARD POSITIONER FOAM (MISCELLANEOUS) ×1 IMPLANT
SLEEVE IRRIGATION ELITE 7 (MISCELLANEOUS) ×1 IMPLANT
SPIKE FLUID TRANSFER (MISCELLANEOUS) IMPLANT
SUT CHROMIC 3 0 SH 27 (SUTURE) IMPLANT
SUT PLAIN 3 0 PS2 27 (SUTURE) IMPLANT
SYR BULB IRRIG 60ML STRL (SYRINGE) ×1 IMPLANT
SYR CONTROL 10ML LL (SYRINGE) ×1 IMPLANT
TRAY ENT MC OR (CUSTOM PROCEDURE TRAY) ×1 IMPLANT
TUBING IRRIGATION (MISCELLANEOUS) ×1 IMPLANT
YANKAUER SUCT BULB TIP NO VENT (SUCTIONS) ×1 IMPLANT

## 2023-12-15 NOTE — Op Note (Signed)
 12/15/2023  10:20 AM  PATIENT:  Dustin Moore  63 y.o. male  PRE-OPERATIVE DIAGNOSIS:  Nonrestorable teeth #'s  two, six, seven, eight, nine, eleven, thirteen, fifteen, twenty, twenty-one, twenty-two, twenty-three, twenty-four, twenty-five, twenty-six, twenty-seven, twenty-eight, twenty-nine, secondary to dental caries; bilateral mandibular lingual tori  POST-OPERATIVE DIAGNOSIS:  SAME  PROCEDURE:  Procedure(s): Dental extractions of two, six, seven, eight, nine, eleven, thirteen, fifteen, twenty, twenty-one, twenty-two, twenty-three, twenty-four, twenty-five, twenty-six, twenty-seven, twenty-eight, twenty-nine, alveoloplasty, removal of bilateral mandibular lingual tori  SURGEON:  Surgeon(s): Ascencion Lava, DMD  ANESTHESIA:   local and general  EBL:  minimal  DRAINS: none   SPECIMEN:  No Specimen  COUNTS:  YES  PLAN OF CARE: Discharge to home after PACU  PATIENT DISPOSITION:  PACU - hemodynamically stable.   PROCEDURE DETAILS: Dictation # [id number]...  Cornelia Dieter, DMD 12/15/2023 10:20 AM

## 2023-12-15 NOTE — H&P (Signed)
 H&P documentation  -History and Physical Reviewed  -Patient has been re-examined  -No change in the plan of care  Dustin Moore

## 2023-12-15 NOTE — Transfer of Care (Signed)
 Immediate Anesthesia Transfer of Care Note  Patient: Dustin Moore  Procedure(s) Performed: Dental extractions of two, six, seven, eight, nine, eleven, thirteen, fifteen, twenty, twenty-one, twenty-two, twenty-three, twenty-four, twenty-five, twenty-six, twenty-seven, twenty-eight, twenty-nine, aveoloplasty, removal of bilateral mandibular lingual tori (Mouth)  Patient Location: PACU  Anesthesia Type:General  Level of Consciousness: awake, alert , and oriented  Airway & Oxygen Therapy: Patient Spontanous Breathing  Post-op Assessment: Report given to RN and Post -op Vital signs reviewed and stable  Post vital signs: Reviewed and stable  Last Vitals:  Vitals Value Taken Time  BP 146/82 12/15/23 1034  Temp    Pulse 86 12/15/23 1036  Resp 18 12/15/23 1036  SpO2 95 % 12/15/23 1036  Vitals shown include unfiled device data.  Last Pain:  Vitals:   12/15/23 1610  TempSrc:   PainSc: 0-No pain      Patients Stated Pain Goal: 0 (12/15/23 9604)  Complications: No notable events documented.

## 2023-12-15 NOTE — Anesthesia Procedure Notes (Signed)
 Procedure Name: Intubation Date/Time: 12/15/2023 9:19 AM  Performed by: Candance Certain, CRNAPre-anesthesia Checklist: Patient identified, Emergency Drugs available, Suction available and Patient being monitored Patient Re-evaluated:Patient Re-evaluated prior to induction Oxygen Delivery Method: Circle System Utilized Preoxygenation: Pre-oxygenation with 100% oxygen Induction Type: IV induction Ventilation: Oral airway inserted - appropriate to patient size Laryngoscope Size: Glidescope and 3 Grade View: Grade I Nasal Tubes: Nasal Rae and Nasal prep performed Tube size: 7.0 mm Number of attempts: 1 Placement Confirmation: ETT inserted through vocal cords under direct vision, positive ETCO2 and breath sounds checked- equal and bilateral Tube secured with: Tape Dental Injury: Teeth and Oropharynx as per pre-operative assessment  Comments: Patient sprayed afrin x2 sprays to each nostril in short stay. 7.0 ETT easily passed through nasal passage after being well lubricated. Gentle pass through glottis with GS 3 balde. Despite afrin use and ease of tube passing though nasal airway, there was some bleeding from the nare post intubation.

## 2023-12-15 NOTE — Anesthesia Postprocedure Evaluation (Signed)
 Anesthesia Post Note  Patient: Dustin Moore  Procedure(s) Performed: Dental extractions of two, six, seven, eight, nine, eleven, thirteen, fifteen, twenty, twenty-one, twenty-two, twenty-three, twenty-four, twenty-five, twenty-six, twenty-seven, twenty-eight, twenty-nine, aveoloplasty, removal of bilateral mandibular lingual tori (Mouth)     Patient location during evaluation: PACU Anesthesia Type: General Level of consciousness: awake Pain management: pain level controlled Vital Signs Assessment: post-procedure vital signs reviewed and stable Respiratory status: spontaneous breathing, nonlabored ventilation and respiratory function stable Cardiovascular status: blood pressure returned to baseline and stable Postop Assessment: no apparent nausea or vomiting Anesthetic complications: no   No notable events documented.  Last Vitals:  Vitals:   12/15/23 1145 12/15/23 1200  BP: 123/60 129/64  Pulse: 73 74  Resp: 16 15  Temp:  36.9 C  SpO2: 91% 93%    Last Pain:  Vitals:   12/15/23 1200  TempSrc:   PainSc: 4                  Conard Decent

## 2023-12-16 ENCOUNTER — Encounter (HOSPITAL_COMMUNITY): Payer: Self-pay | Admitting: Oral Surgery

## 2023-12-16 NOTE — Op Note (Unsigned)
 NAMEKRON, Dustin Moore: [id number] ACCOUNT Moore: [id number] DATE OF BIRTH: 12/01/1960 FACILITY: [facility] LOCATION: [facility] PHYSICIAN: Cornelia Dieter, DDS  Operative Report   DATE OF PROCEDURE: 12/15/2023  PREOPERATIVE DIAGNOSES:  Nonrestorable teeth secondary to dental caries numbers 2, 6, 7, 8, 9, 10, 11, 13, 15, 20, 21, 22, 23, 24, 25, 26, 27, 28, and 29.  POSTOPERATIVE DIAGNOSES:  Nonrestorable teeth secondary to dental caries numbers 2, 6, 7, 8, 9, 10, 11, 13, 15, 20, 21, 22, 23, 24, 25, 26, 27, 28, and 29.  PROCEDURE:  Extraction of teeth numbers 2, 6, 7, 8, 9, 10, 11, 13, 15, 20, 21, 22, 23, 24, 25, 26, 27, 28, and 29.  Alveoloplasty right and left maxilla and mandible, removal of bilateral lingual tori.  SURGEON:  Cornelia Dieter, DDS  ANESTHESIA:  General.  Nasal intubation.  Dr. Debborah Fairly, attending.  DESCRIPTION OF PROCEDURE:  The patient was taken to the operating room and placed on the table in supine position.  General anesthesia was administered.  A nasal endotracheal tube was placed and secured.  The eyes were protected.  The patient was draped  for surgery.  Timeout was performed.  The posterior pharynx was suctioned and a throat pack was placed.  2% lidocaine  with 1:100,000 epinephrine was infiltrated in an inferior alveolar block on the right and left side and then buccal and palatal  infiltration of the maxilla.  A bite block was placed on the right side of the mouth.  The left side was operated first and a 15 blade to make an incision 1 cm proximal to tooth #20 on the alveolar crest, carried forward in the buccal and lingual  gingival sulcus until tooth #27 was encountered.  The periosteum was reflected.  Tooth numbers 20 and 21 required removal of interproximal bone before they could be elevated and removed.  Then the teeth were removed with the Ash forceps.  Tooth numbers  22, 23, 24, 25, and 26 were removed after elevating with the 301 elevator by using the Ash  forceps and the sockets were curetted.  The periosteum was reflected to expose the alveolar crest, which was irregular in contour.  Alveoloplasty was performed  using the egg burr in the Stryker handpiece under irrigation.  The lingual tissue was reflected to expose the lingual torus.  This was reduced using the egg burr and the Stryker handpiece.  Then, the area was irrigated and closed with 3-0 chromic.   Attention was turned to the left maxilla.  A #15 blade was used to make an incision around tooth #15, carried forward to tooth #13, and then 11, 10, 9, 8, and 7 in the buccal and palatal sulcus.  The periosteum was reflected.  The teeth were elevated  with the 301 elevator and removed with the dental forceps.  The sockets were curetted.  The periosteum was reflected to expose the alveolar crest, which is irregular and contour.  Alveoloplasty was performed using the egg burr followed by the bone file  and the area was irrigated and closed with 3-0 chromic.  Attention was then turned to the right side.  The 15 blade was used to make an incision around teeth numbers 27, 28, and 29 in the mandible and around teeth numbers 2 and 6 in the maxilla.  The  periosteum was reflected.  The teeth were elevated and removed with the dental forceps.  The sockets were curetted.  The maxillary tissue was reflected and alveoloplasty was  performed around the canine and then closed with 3-0 chromic.  Then, the  mandibular tissue was reflected buccally and lingually to expose the lingual torus and the alveolar crest, which was irregular in contour.  The egg burr was used to perform alveoloplasty and then reduce the lingual torus until there was a smooth bony  contour.  Then, the area was further smoothed with a bone file and then irrigated and closed with 3-0 chromic.  The oral cavity was then irrigated and suctioned.  The throat pack was removed.  The patient was left in care of anesthesia for extubation,  transport to  recovery, and discharged home through day surgery.  ESTIMATED BLOOD LOSS:  Minimum.  COMPLICATIONS:  None.  SPECIMENS:  None.  COUNTS:  Correct.   PUS D: 12/15/2023 10:25:09 am T: 12/16/2023 2:22:00 am  JOB: [id number]/ [id number]

## 2023-12-18 ENCOUNTER — Encounter (HOSPITAL_COMMUNITY): Payer: Self-pay

## 2023-12-25 ENCOUNTER — Encounter: Payer: Self-pay | Admitting: Internal Medicine

## 2023-12-25 ENCOUNTER — Ambulatory Visit: Payer: Self-pay | Admitting: Internal Medicine

## 2023-12-25 VITALS — BP 130/71 | HR 76 | Ht 70.0 in | Wt 235.4 lb

## 2023-12-25 DIAGNOSIS — I10 Essential (primary) hypertension: Secondary | ICD-10-CM | POA: Diagnosis not present

## 2023-12-25 DIAGNOSIS — J449 Chronic obstructive pulmonary disease, unspecified: Secondary | ICD-10-CM

## 2023-12-25 DIAGNOSIS — Z0001 Encounter for general adult medical examination with abnormal findings: Secondary | ICD-10-CM | POA: Diagnosis not present

## 2023-12-25 DIAGNOSIS — E1165 Type 2 diabetes mellitus with hyperglycemia: Secondary | ICD-10-CM | POA: Diagnosis not present

## 2023-12-25 DIAGNOSIS — E782 Mixed hyperlipidemia: Secondary | ICD-10-CM | POA: Diagnosis not present

## 2023-12-25 DIAGNOSIS — Z794 Long term (current) use of insulin: Secondary | ICD-10-CM

## 2023-12-25 DIAGNOSIS — Z720 Tobacco use: Secondary | ICD-10-CM

## 2023-12-25 DIAGNOSIS — M7989 Other specified soft tissue disorders: Secondary | ICD-10-CM

## 2023-12-25 NOTE — Assessment & Plan Note (Addendum)
 Lab Results  Component Value Date   HGBA1C 7.7 (A) 10/12/2023   Uncontrolled, but improving On Basaglar  25 units nightly, ISS and metformin  500 mg twice daily Did not tolerate GLP-1 agonists Advised to follow diabetic diet On ARB and statin F/u CMP and lipid panel Diabetic eye exam: Advised to follow up with Ophthalmology for diabetic eye exam

## 2023-12-25 NOTE — Assessment & Plan Note (Signed)
 On Crestor  considering his  history of type II DM

## 2023-12-25 NOTE — Patient Instructions (Signed)
 Please continue to take medications as prescribed.  Please continue to follow low carb diet and perform moderate exercise/walking at least 150 mins/week.

## 2023-12-25 NOTE — Assessment & Plan Note (Signed)
 BP Readings from Last 1 Encounters:  12/25/23 130/71   Well-controlled with losartan-HCTZ 50-12.5 mg once daily Lasix  as needed for persistent swelling only Counseled for compliance with the medications Advised DASH diet and moderate exercise/walking, at least 150 mins/week

## 2023-12-25 NOTE — Assessment & Plan Note (Signed)
 Well-controlled with Symbicort  and PRN Albuterol  Trying to cut down smoking

## 2023-12-25 NOTE — Assessment & Plan Note (Addendum)
 Better now, likely due to chronic venous insufficiency Advised to perform leg elevation and use compression socks as tolerated Advised to cut down salt intake Lasix  as needed for persistent swelling

## 2023-12-25 NOTE — Progress Notes (Signed)
 Established Patient Office Visit  Subjective:  Patient ID: Dustin Moore, male    DOB: [birth year]  Age: 63 y.o. MRN: [id number]  CC:  Chief Complaint  Patient presents with   Medical Management of Chronic Issues    4 month f/u    HPI Dustin Moore is a 63 y.o. male with past medical history of HTN, type II DM, HLD, asthma, obesity and tobacco abuse who presents for f/u of his chronic medical conditions.    HTN: BP is well-controlled. Takes losartan-HCTZ  50-12.5 mg QD regularly. Patient denies headache, dizziness, chest pain, dyspnea or palpitations.   Type II DM: He takes Basaglar  25 units nightly, ISS and metformin  500 mg twice daily.  He did not tolerate Trulicity or Ozempic in the past.  Currently followed by endocrinology.  He has CGM, which shows hyperglycemia spells recently as he has to eat soft food due to recent dental workup, has been eating mashed potatoes.   Has started taking Crestor  to reduce CVA and CAD risk.  Asthma: He uses Symbicort  and as needed albuterol  currently.  He requests refill of Symbicort  currently.  He has cut down smoking, currently smokes 3 to 4 cigarettes a day.  Denies any dyspnea or wheezing currently.   Past Medical History:  Diagnosis Date   Asthma    Diabetes mellitus without complication (HCC)    type 2   Hypertension    Rotator cuff disorder, left     Past Surgical History:  Procedure Laterality Date   ROTATOR CUFF REPAIR Left 08/2021   TONSILLECTOMY AND ADENOIDECTOMY     TOOTH EXTRACTION N/A 12/15/2023   Procedure: Dental extractions of two, six, seven, eight, nine, eleven, thirteen, fifteen, twenty, twenty-one, twenty-two, twenty-three, twenty-four, twenty-five, twenty-six, twenty-seven, twenty-eight, twenty-nine, aveoloplasty, removal of bilateral mandibular lingual tori;  Surgeon: Ascencion Lava, DMD;  Location: [facility] OR;  Service: Oral Surgery;  Laterality: N/A;   TOTAL HIP ARTHROPLASTY Left 2013    Family History  Problem Relation Age  of Onset   Diabetes Mother    Cirrhosis Mother    Heart disease Father    Heart attack Father    Heart disease Paternal Uncle     Social History   Socioeconomic History   Marital status: Single    Spouse name: Not on file   Number of children: Not on file   Years of education: Not on file   Highest education level: Not on file  Occupational History   Not on file  Tobacco Use   Smoking status: Every Day    Current packs/day: 0.50    Average packs/day: 0.5 packs/day for 39.0 years (19.5 ttl pk-yrs)    Types: Cigarettes   Smokeless tobacco: Never   Tobacco comments:    5 cigarettes per day as of 12/25/23  Vaping Use   Vaping status: Never Used  Substance and Sexual Activity   Alcohol use: Not Currently    Comment: occasinally on weekends    Drug use: Not Currently    Types: Marijuana, Cocaine    Comment: last percocet use 01/2022 (no MJ or cocaine since he was in his 30s)   Sexual activity: Yes  Other Topics Concern   Not on file  Social History Narrative   Not on file   Social Drivers of Health   Financial Resource Strain: Not on File (03/28/2018)   Received from [facility], [employer]    Financial Resource Strain: 0  Food Insecurity: Not on File (  05/04/2023)   Received from [employer] [facility]    Food: 0  Transportation Needs: Not on File (03/28/2018)   Received from [place], [employer] Needs    Transportation: 0  Physical Activity: Not on File (03/28/2018)   Received from [place], [place]   Physical Activity    Physical Activity: 0  Stress: Not on File (03/28/2018)   Received from [facility], [place]   Stress    Stress: 0  Social Connections: Not on File (04/27/2023)   Received from [facility]   Social Connections    Connectedness: 0  Intimate Partner Violence: Not on file    Outpatient Medications Prior to Visit  Medication Sig Dispense Refill   aspirin EC 81 MG tablet Take 81 mg by mouth daily.     B Complex-C (B-COMPLEX WITH VITAMIN  C) tablet Take 1 tablet by mouth daily.     Continuous Glucose Sensor (DEXCOM G7 SENSOR) MISC Inject 1 Application into the skin as directed. Change sensor every 10 days as directed. 9 each 3   DULoxetine (CYMBALTA) 60 MG capsule TAKE 1 CAPSULE BY MOUTH DAILY 30 capsule 0   furosemide  (LASIX ) 40 MG tablet Take 1 tablet (40 mg total) by mouth daily. 30 tablet 3   ibuprofen (ADVIL) 200 MG tablet Take 400 mg by mouth every 8 (eight) hours as needed for moderate pain (pain score 4-6).     Insulin  Glargine (BASAGLAR  KWIKPEN) 100 UNIT/ML Inject 25 Units into the skin at bedtime.     insulin  lispro (HUMALOG  KWIKPEN) 100 UNIT/ML KwikPen Inject 8-14 Units into the skin 3 (three) times daily. 36 mL 3   Insulin  Pen Needle (PEN NEEDLES) 31G X 8 MM MISC Use to inject insulin  4 times daily 300 each 3   ipratropium-albuterol  (DUONEB) 0.5-2.5 (3) MG/3ML SOLN USE 1 VIAL VIA NEBULIZER EVERY 4 HOURS AS NEEDED FOR WHEEZING OR SHORTNESS OF BREATH. 1620 mL 2   losartan-hydrochlorothiazide (HYZAAR) 50-12.5 MG tablet TAKE 1 TABLET BY MOUTH DAILY 30 tablet 0   MAGNESIUM PO Take 500 mg by mouth daily.     metFORMIN  (GLUCOPHAGE ) 500 MG tablet Take 1 tablet (500 mg total) by mouth 2 (two) times daily with a meal. 180 tablet 1   nicotine  (NICODERM CQ ) 14 mg/24hr patch Place 1 patch (14 mg total) onto the skin daily. 28 patch 0   Omega 3-6-9 Fatty Acids (TRIPLE OMEGA COMPLEX PO) Take 1 capsule by mouth daily.     oxyCODONE -acetaminophen  (PERCOCET) 5-325 MG tablet Take 1 tablet by mouth every 4 (four) hours as needed. 24 tablet 0   Potassium 99 MG TABS Take 99 mg by mouth daily.     rosuvastatin  (CRESTOR ) 5 MG tablet TAKE 1 TABLET(5 MG) BY MOUTH EVERY EVENING 90 tablet 1   UNABLE TO FIND 1 each by Does not apply route daily. Med Name: NEBULIZER 1 each 0   amoxicillin  (AMOXIL ) 500 MG capsule Take 1 capsule (500 mg total) by mouth 3 (three) times daily. 21 capsule 0   SYMBICORT  160-4.5 MCG/ACT inhaler INHALE 2 PUFFS INTO THE  LUNGS IN THE MORNING AND AT BEDTIME 10.2 g 0   VENTOLIN  HFA 108 (90 Base) MCG/ACT inhaler INHALE 1 TO 2 PUFFS INTO THE LUNGS EVERY 6 HOURS AS NEEDED FOR WHEEZING OR SHORTNESS OF BREATH 18 g 3   No facility-administered medications prior to visit.    Allergies  Allergen Reactions   Contrast Media [Iodinated Contrast Media] Anaphylaxis    ROS Review of Systems  Constitutional:  Negative for chills and fever.  HENT:  Positive for dental problem. Negative for congestion and sore throat.   Eyes:  Negative for pain and discharge.  Respiratory:  Negative for cough and shortness of breath.   Cardiovascular:  Positive for leg swelling. Negative for chest pain and palpitations.  Gastrointestinal:  Negative for diarrhea, nausea and vomiting.  Endocrine: Negative for polydipsia and polyuria.  Genitourinary:  Negative for dysuria and hematuria.  Musculoskeletal:  Negative for neck pain and neck stiffness.  Skin:  Negative for rash.  Neurological:  Negative for dizziness, weakness, numbness and headaches.  Psychiatric/Behavioral:  Negative for agitation and behavioral problems.       Objective:     Physical Exam Vitals reviewed.  Constitutional:      General: He is not in acute distress.    Appearance: He is not diaphoretic.  HENT:     Head: Normocephalic and atraumatic.     Nose: Nose normal.     Mouth/Throat:     Mouth: Mucous membranes are moist.  Eyes:     General: No scleral icterus.    Extraocular Movements: Extraocular movements intact.  Cardiovascular:     Rate and Rhythm: Normal rate and regular rhythm.     Heart sounds: Normal heart sounds. No murmur heard. Pulmonary:     Breath sounds: Normal breath sounds. No wheezing or rales.  Abdominal:     Palpations: Abdomen is soft.     Tenderness: There is no abdominal tenderness.  Musculoskeletal:     Cervical back: Neck supple. No tenderness.     Right lower leg: No edema.     Left lower leg: No edema.  Skin:     General: Skin is warm.     Findings: No rash.  Neurological:     General: No focal deficit present.     Mental Status: He is alert and oriented to person, place, and time.     Cranial Nerves: No cranial nerve deficit.     Sensory: No sensory deficit.     Motor: No weakness.  Psychiatric:        Mood and Affect: Mood normal.        Behavior: Behavior normal.     BP 130/71   Pulse 76   Ht 5\' 10"  (1.778 m)   Wt 235 lb 6.4 oz (106.8 kg)   SpO2 94%   BMI 33.78 kg/m  Wt Readings from Last 3 Encounters:  12/25/23 235 lb 6.4 oz (106.8 kg)  12/15/23 220 lb (99.8 kg)  10/12/23 224 lb 6.4 oz (101.8 kg)    Lab Results  Component Value Date   TSH 1.820 06/13/2023   Lab Results  Component Value Date   WBC 6.1 06/13/2023   HGB 11.5 (L) 06/13/2023   HCT 35.2 (L) 06/13/2023   MCV 91 06/13/2023   PLT 156 06/13/2023   Lab Results  Component Value Date   NA 139 12/25/2023   K 4.4 12/25/2023   CO2 25 12/25/2023   GLUCOSE 176 (H) 12/25/2023   BUN 13 12/25/2023   CREATININE 1.36 (H) 12/25/2023   BILITOT <0.2 12/25/2023   ALKPHOS 116 12/25/2023   AST 31 12/25/2023   ALT 34 12/25/2023   PROT 6.5 12/25/2023   ALBUMIN 4.2 12/25/2023   CALCIUM  9.2 12/25/2023   EGFR 60 12/25/2023   Lab Results  Component Value Date   CHOL 129 12/25/2023   Lab Results  Component Value Date   HDL 37 (L)  12/25/2023   Lab Results  Component Value Date   LDLCALC 65 12/25/2023   Lab Results  Component Value Date   TRIG 156 (H) 12/25/2023   Lab Results  Component Value Date   CHOLHDL 3.5 12/25/2023   Lab Results  Component Value Date   HGBA1C 7.7 (A) 10/12/2023      Assessment & Plan:   Problem List Items Addressed This Visit       Cardiovascular and Mediastinum   Primary hypertension   BP Readings from Last 1 Encounters:  12/25/23 130/71   Well-controlled with losartan-HCTZ 50-12.5 mg once daily Lasix  as needed for persistent swelling only Counseled for compliance with the  medications Advised DASH diet and moderate exercise/walking, at least 150 mins/week        Respiratory   Chronic obstructive pulmonary disease (HCC)   Well-controlled with Symbicort  and PRN Albuterol  Trying to cut down smoking      Relevant Medications   SYMBICORT  160-4.5 MCG/ACT inhaler   VENTOLIN  HFA 108 (90 Base) MCG/ACT inhaler     Endocrine   Type 2 diabetes mellitus with hyperglycemia, with long-term current use of insulin  (HCC)   Lab Results  Component Value Date   HGBA1C 7.7 (A) 10/12/2023   Uncontrolled, but improving On Basaglar  25 units nightly, ISS and metformin  500 mg twice daily Did not tolerate GLP-1 agonists Advised to follow diabetic diet On ARB and statin F/u CMP and lipid panel Diabetic eye exam: Advised to follow up with Ophthalmology for diabetic eye exam       Relevant Orders   CMP14+EGFR (Completed)     Other   HLD (hyperlipidemia)   On Crestor  considering his  history of type II DM      Relevant Orders   Lipid Profile (Completed)   Tobacco use   Smokes about 0.25 pack/day  Asked about quitting: confirms that he/she currently smokes cigarettes Advise to quit smoking: Educated about QUITTING to reduce the risk of cancer, cardio and cerebrovascular disease. Assess willingness: Unwilling to quit at this time, but is working on cutting back. Assist with counseling and pharmacotherapy: Counseled for 5 minutes and literature provided. Arrange for follow up: follow up in 3 months and continue to offer help.      Leg swelling   Better now, likely due to chronic venous insufficiency Advised to perform leg elevation and use compression socks as tolerated Advised to cut down salt intake Lasix  as needed for persistent swelling      Encounter for general adult medical examination with abnormal findings - Primary   Physical exam as documented. Fasting blood tests today. Denies Shingrix  and pneumococcal vaccines today.       Meds ordered this  encounter  Medications   SYMBICORT  160-4.5 MCG/ACT inhaler    Sig: Inhale 2 puffs into the lungs 2 (two) times daily. in the morning and at bedtime.    Dispense:  33 g    Refill:  3   VENTOLIN  HFA 108 (90 Base) MCG/ACT inhaler    Sig: Inhale 1-2 puffs into the lungs every 6 (six) hours as needed for wheezing or shortness of breath.    Dispense:  54 g    Refill:  3    Follow-up: Return in about 6 months (around 06/26/2024) for HTN and COPD.    Meldon Sport, MD

## 2023-12-25 NOTE — Assessment & Plan Note (Signed)
Smokes about 0.25 pack/day  Asked about quitting: confirms that he/she currently smokes cigarettes Advise to quit smoking: Educated about QUITTING to reduce the risk of cancer, cardio and cerebrovascular disease. Assess willingness: Unwilling to quit at this time, but is working on cutting back. Assist with counseling and pharmacotherapy: Counseled for 5 minutes and literature provided. Arrange for follow up: follow up in 3 months and continue to offer help. 

## 2023-12-26 ENCOUNTER — Other Ambulatory Visit: Payer: Self-pay | Admitting: Internal Medicine

## 2023-12-26 ENCOUNTER — Ambulatory Visit: Payer: Self-pay | Admitting: Internal Medicine

## 2023-12-26 DIAGNOSIS — Z0001 Encounter for general adult medical examination with abnormal findings: Secondary | ICD-10-CM | POA: Insufficient documentation

## 2023-12-26 LAB — CMP14+EGFR
ALT: 34 IU/L (ref 0–44)
AST: 31 IU/L (ref 0–40)
Albumin: 4.2 g/dL (ref 3.8–4.9)
Alkaline Phosphatase: 116 IU/L (ref 44–121)
BUN/Creatinine Ratio: 10 (ref 10–24)
BUN: 13 mg/dL (ref 8–27)
Bilirubin Total: <0.2 mg/dL (ref 0.0–1.2)
CO2: 25 mmol/L (ref 20–29)
Calcium: 9.2 mg/dL (ref 8.6–10.2)
Chloride: 98 mmol/L (ref 96–106)
Creatinine, Ser: 1.36 mg/dL — ABNORMAL HIGH (ref 0.76–1.27)
Globulin, Total: 2.3 g/dL (ref 1.5–4.5)
Glucose: 176 mg/dL — ABNORMAL HIGH (ref 70–99)
Potassium: 4.4 mmol/L (ref 3.5–5.2)
Sodium: 139 mmol/L (ref 134–144)
Total Protein: 6.5 g/dL (ref 6.0–8.5)
eGFR: 60 mL/min/{1.73_m2} (ref >59)

## 2023-12-26 LAB — LIPID PANEL
Chol/HDL Ratio: 3.5 ratio (ref 0.0–5.0)
Cholesterol, Total: 129 mg/dL (ref 100–199)
HDL: 37 mg/dL — ABNORMAL LOW (ref >39)
LDL Chol Calc (NIH): 65 mg/dL (ref 0–99)
Triglycerides: 156 mg/dL — ABNORMAL HIGH (ref 0–149)
VLDL Cholesterol Cal: 27 mg/dL (ref 5–40)

## 2023-12-26 MED ORDER — SYMBICORT 160-4.5 MCG/ACT IN AERO: 2.0000 | INHALATION_SPRAY | Freq: Two times a day (BID) | RESPIRATORY_TRACT | 3 refills | Status: AC

## 2023-12-26 MED ORDER — VENTOLIN HFA 108 (90 BASE) MCG/ACT IN AERS: 1.0000 | INHALATION_SPRAY | Freq: Four times a day (QID) | RESPIRATORY_TRACT | 3 refills | Status: AC | PRN

## 2023-12-26 NOTE — Assessment & Plan Note (Signed)
 Physical exam as documented. Fasting blood tests today. Denies Shingrix  and pneumococcal vaccines today.

## 2023-12-29 ENCOUNTER — Encounter (HOSPITAL_COMMUNITY): Payer: Self-pay

## 2023-12-29 ENCOUNTER — Other Ambulatory Visit: Payer: Self-pay

## 2023-12-29 ENCOUNTER — Emergency Department (HOSPITAL_COMMUNITY)
Admission: EM | Admit: 2023-12-29 | Discharge: 2023-12-30 | Disposition: A | Discharge status: Home / Self Care | Attending: Student | Admitting: Student

## 2023-12-29 DIAGNOSIS — T40601A Poisoning by unspecified narcotics, accidental (unintentional), initial encounter: Secondary | ICD-10-CM

## 2023-12-29 DIAGNOSIS — R001 Bradycardia, unspecified: Secondary | ICD-10-CM | POA: Insufficient documentation

## 2023-12-29 DIAGNOSIS — E871 Hypo-osmolality and hyponatremia: Secondary | ICD-10-CM | POA: Diagnosis not present

## 2023-12-29 DIAGNOSIS — Z794 Long term (current) use of insulin: Secondary | ICD-10-CM | POA: Diagnosis not present

## 2023-12-29 DIAGNOSIS — Z7951 Long term (current) use of inhaled steroids: Secondary | ICD-10-CM | POA: Diagnosis not present

## 2023-12-29 DIAGNOSIS — I10 Essential (primary) hypertension: Secondary | ICD-10-CM | POA: Diagnosis not present

## 2023-12-29 DIAGNOSIS — F17210 Nicotine dependence, cigarettes, uncomplicated: Secondary | ICD-10-CM | POA: Insufficient documentation

## 2023-12-29 DIAGNOSIS — E119 Type 2 diabetes mellitus without complications: Secondary | ICD-10-CM | POA: Diagnosis not present

## 2023-12-29 DIAGNOSIS — D649 Anemia, unspecified: Secondary | ICD-10-CM | POA: Insufficient documentation

## 2023-12-29 DIAGNOSIS — T402X1A Poisoning by other opioids, accidental (unintentional), initial encounter: Secondary | ICD-10-CM | POA: Diagnosis present

## 2023-12-29 DIAGNOSIS — J45909 Unspecified asthma, uncomplicated: Secondary | ICD-10-CM | POA: Diagnosis not present

## 2023-12-29 DIAGNOSIS — Z7984 Long term (current) use of oral hypoglycemic drugs: Secondary | ICD-10-CM | POA: Insufficient documentation

## 2023-12-29 DIAGNOSIS — Z79899 Other long term (current) drug therapy: Secondary | ICD-10-CM | POA: Diagnosis not present

## 2023-12-29 DIAGNOSIS — Z7982 Long term (current) use of aspirin: Secondary | ICD-10-CM | POA: Diagnosis not present

## 2023-12-29 LAB — COMPREHENSIVE METABOLIC PANEL WITH GFR
ALT: 25 U/L (ref 0–44)
AST: 30 U/L (ref 15–41)
Albumin: 4.1 g/dL (ref 3.5–5.0)
Alkaline Phosphatase: 81 U/L (ref 38–126)
Anion gap: 10 (ref 5–15)
BUN: 16 mg/dL (ref 6–20)
CO2: 24 mmol/L (ref 22–32)
Calcium: 9.4 mg/dL (ref 8.9–10.3)
Chloride: 98 mmol/L (ref 98–111)
Creatinine, Ser: 1.24 mg/dL (ref 0.61–1.24)
GFR, Estimated: >60 mL/min (ref >60)
Glucose, Bld: 247 mg/dL — ABNORMAL HIGH (ref 70–99)
Potassium: 4.4 mmol/L (ref 3.5–5.1)
Sodium: 132 mmol/L — ABNORMAL LOW (ref 135–145)
Total Bilirubin: 0.2 mg/dL (ref 0.0–1.2)
Total Protein: 7.5 g/dL (ref 6.5–8.1)

## 2023-12-29 LAB — CBC WITH DIFFERENTIAL/PLATELET
Abs Immature Granulocytes: 0.01 10*3/uL (ref 0.00–0.07)
Basophils Absolute: 0 10*3/uL (ref 0.0–0.1)
Basophils Relative: 0 %
Eosinophils Absolute: 0.4 10*3/uL (ref 0.0–0.5)
Eosinophils Relative: 6 %
HCT: 33.1 % — ABNORMAL LOW (ref 39.0–52.0)
Hemoglobin: 10.8 g/dL — ABNORMAL LOW (ref 13.0–17.0)
Immature Granulocytes: 0 %
Lymphocytes Relative: 36 %
Lymphs Abs: 2.2 10*3/uL (ref 0.7–4.0)
MCH: 30 pg (ref 26.0–34.0)
MCHC: 32.6 g/dL (ref 30.0–36.0)
MCV: 91.9 fL (ref 80.0–100.0)
Monocytes Absolute: 0.6 10*3/uL (ref 0.1–1.0)
Monocytes Relative: 9 %
Neutro Abs: 3.1 10*3/uL (ref 1.7–7.7)
Neutrophils Relative %: 49 %
Platelets: 173 10*3/uL (ref 150–400)
RBC: 3.6 MIL/uL — ABNORMAL LOW (ref 4.22–5.81)
RDW: 13.2 % (ref 11.5–15.5)
WBC: 6.2 10*3/uL (ref 4.0–10.5)
nRBC: 0 % (ref 0.0–0.2)

## 2023-12-29 LAB — CBG MONITORING, ED: Glucose-Capillary: 203 mg/dL — ABNORMAL HIGH (ref 70–99)

## 2023-12-29 MED ORDER — LORAZEPAM 2 MG/ML IJ SOLN
0.5000 mg | Freq: Once | INTRAMUSCULAR | Status: AC
  Administered 2023-12-29: 0.5 mg via INTRAVENOUS
  Filled 2023-12-29: qty 1

## 2023-12-29 MED ORDER — ONDANSETRON HCL 4 MG/2ML IJ SOLN
4.0000 mg | Freq: Once | INTRAMUSCULAR | Status: AC
  Administered 2023-12-29: 4 mg via INTRAVENOUS
  Filled 2023-12-29: qty 2

## 2023-12-29 MED ORDER — DIAZEPAM 5 MG/ML IJ SOLN
5.0000 mg | Freq: Once | INTRAMUSCULAR | Status: AC
  Administered 2023-12-29: 5 mg via INTRAVENOUS
  Filled 2023-12-29: qty 2

## 2023-12-29 MED ORDER — NALOXONE HCL 2 MG/2ML IJ SOSY
PREFILLED_SYRINGE | INTRAMUSCULAR | Status: AC
  Administered 2023-12-29: 2 mg
  Filled 2023-12-29: qty 2

## 2023-12-29 MED ORDER — NALOXONE HCL 4 MG/0.1ML NA LIQD
NASAL | Status: AC
  Administered 2023-12-29: 4 mg
  Filled 2023-12-29: qty 4

## 2023-12-29 NOTE — ED Triage Notes (Signed)
 Pt snorted a percocet and believes it was laced with fentanyl .

## 2023-12-29 NOTE — ED Provider Notes (Signed)
 Care assumed from Dr. Jeannie Milo, patient with fentanyl  overdose, required lorazepam for agitation, being observed until able to ambulate safely.  Patient is now awake and alert and safe for discharge.  I am discharging him with a prescription for naloxone nasal spray.   Alissa April, MD 12/30/23 [phone]

## 2023-12-30 MED ORDER — NALOXONE HCL 4 MG/0.1ML NA LIQD: NASAL | 5 refills | Status: DC

## 2023-12-30 NOTE — ED Provider Notes (Signed)
 [facility] EMERGENCY DEPARTMENT AT [facility] Provider Note  CSN: [id number] Arrival date & time: 12/29/23 2038  Chief Complaint(s) Drug Overdose  HPI Dustin Moore is a 63 y.o. male With PMH asthma, T2DM, polysubstance use who presents emergency room for evaluation of an opioid overdose.  Patient states that he snorted what he thought was a Percocet and started become very short of breath and somnolent.  Wife attempted to department and patient is dozing off during the interview having trouble breathing.  Additional history unable to be obtained due to patient's current intoxication.  Past Medical History Past Medical History:  Diagnosis Date   Asthma    Diabetes mellitus without complication (HCC)    type 2   Hypertension    Rotator cuff disorder, left    Patient Active Problem List   Diagnosis Date Noted   Encounter for general adult medical examination with abnormal findings 12/26/2023   Leg swelling 08/07/2023   Chronic obstructive pulmonary disease (HCC) 06/19/2023   Tobacco use 06/13/2023   Class 1 obesity with serious comorbidity and body mass index (BMI) of 33.0 to 33.9 in adult 06/13/2023   Type 2 diabetes mellitus with hyperglycemia, with long-term current use of insulin  (HCC) 08/11/2022   Primary hypertension 08/11/2022   HLD (hyperlipidemia) 08/11/2022   Type 2 diabetes mellitus with diabetic neuropathy, with long-term current use of insulin  (HCC) 08/11/2022   Home Medication(s) Prior to Admission medications   Medication Sig Start Date End Date Taking? Authorizing Provider  naloxone [facility]) nasal spray 4 mg/0.1 mL Put into the nose as needed for opiate overdose 12/30/23  Yes Alissa April, MD  aspirin EC 81 MG tablet Take 81 mg by mouth daily.    [provider]  B Complex-C (B-COMPLEX WITH VITAMIN C) tablet Take 1 tablet by mouth daily.    [provider]  Continuous Glucose Sensor (DEXCOM G7 SENSOR) MISC Inject 1 Application into the  skin as directed. Change sensor every 10 days as directed. 10/12/23   Wendel Hals, NP  DULoxetine (CYMBALTA) 60 MG capsule TAKE 1 CAPSULE BY MOUTH DAILY 12/04/23   Meldon Sport, MD  furosemide  (LASIX ) 40 MG tablet Take 1 tablet (40 mg total) by mouth daily. 09/25/23   Patel, Rutwik K, MD  ibuprofen (ADVIL) 200 MG tablet Take 400 mg by mouth every 8 (eight) hours as needed for moderate pain (pain score 4-6).    [provider]  Insulin  Glargine (BASAGLAR  KWIKPEN) 100 UNIT/ML Inject 25 Units into the skin at bedtime. 10/12/23   Wendel Hals, NP  insulin  lispro (HUMALOG  KWIKPEN) 100 UNIT/ML KwikPen Inject 8-14 Units into the skin 3 (three) times daily. 06/16/23   Wendel Hals, NP  Insulin  Pen Needle (PEN NEEDLES) 31G X 8 MM MISC Use to inject insulin  4 times daily 06/16/23   Wendel Hals, NP  ipratropium-albuterol  (DUONEB) 0.5-2.5 (3) MG/3ML SOLN USE 1 VIAL VIA NEBULIZER EVERY 4 HOURS AS NEEDED FOR WHEEZING OR SHORTNESS OF BREATH. 07/17/23   Meldon Sport, MD  losartan-hydrochlorothiazide (HYZAAR) 50-12.5 MG tablet TAKE 1 TABLET BY MOUTH DAILY 12/26/23   Patel, Rutwik K, MD  MAGNESIUM PO Take 500 mg by mouth daily.    [provider]  metFORMIN  (GLUCOPHAGE ) 500 MG tablet Take 1 tablet (500 mg total) by mouth 2 (two) times daily with a meal. 10/12/23   Wendel Hals, NP  nicotine  (NICODERM CQ ) 14 mg/24hr patch Place 1 patch (14 mg total) onto the skin  daily. 12/15/23   Ascencion Lava, DMD  Omega 3-6-9 Fatty Acids (TRIPLE OMEGA COMPLEX PO) Take 1 capsule by mouth daily.    [provider]  oxyCODONE -acetaminophen  (PERCOCET) 5-325 MG tablet Take 1 tablet by mouth every 4 (four) hours as needed. 12/15/23   Ascencion Lava, DMD  Potassium 99 MG TABS Take 99 mg by mouth daily.    [provider]  rosuvastatin  (CRESTOR ) 5 MG tablet TAKE 1 TABLET(5 MG) BY MOUTH EVERY EVENING 09/14/23   Meldon Sport, MD  SYMBICORT  160-4.5 MCG/ACT inhaler Inhale 2 puffs into  the lungs 2 (two) times daily. in the morning and at bedtime. 12/26/23   Meldon Sport, MD  UNABLE TO FIND 1 each by Does not apply route daily. Med Name: NEBULIZER 06/20/23   Meldon Sport, MD  VENTOLIN  HFA 108 [phone] Base) MCG/ACT inhaler Inhale 1-2 puffs into the lungs every 6 (six) hours as needed for wheezing or shortness of breath. 12/26/23   Meldon Sport, MD                                                                                                                                    Past Surgical History Past Surgical History:  Procedure Laterality Date   ROTATOR CUFF REPAIR Left 08/2021   TONSILLECTOMY AND ADENOIDECTOMY     TOOTH EXTRACTION N/A 12/15/2023   Procedure: Dental extractions of two, six, seven, eight, nine, eleven, thirteen, fifteen, twenty, twenty-one, twenty-two, twenty-three, twenty-four, twenty-five, twenty-six, twenty-seven, twenty-eight, twenty-nine, aveoloplasty, removal of bilateral mandibular lingual tori;  Surgeon: Ascencion Lava, DMD;  Location: [facility] OR;  Service: Oral Surgery;  Laterality: N/A;   TOTAL HIP ARTHROPLASTY Left 2013   Family History Family History  Problem Relation Age of Onset   Diabetes Mother    Cirrhosis Mother    Heart disease Father    Heart attack Father    Heart disease Paternal Uncle     Social History Social History   Tobacco Use   Smoking status: Every Day    Current packs/day: 0.50    Average packs/day: 0.5 packs/day for 39.0 years (19.5 ttl pk-yrs)    Types: Cigarettes   Smokeless tobacco: Never   Tobacco comments:    5 cigarettes per day as of 12/25/23  Vaping Use   Vaping status: Never Used  Substance Use Topics   Alcohol use: Not Currently    Comment: occasinally on weekends    Drug use: Not Currently    Types: Marijuana, Cocaine    Comment: last percocet use 01/2022 (no MJ or cocaine since he was in his 30s)   Allergies Contrast media [iodinated contrast media]  Review of Systems Review of Systems  Unable to  perform ROS: Mental status change    Physical Exam Vital Signs  I have reviewed the triage vital signs BP 133/63   Pulse (!) 49   Temp 97.9 F (36.6 C) (Axillary)  Resp 17   Ht 5\' 10"  (1.778 m)   Wt 106 kg   SpO2 95%   BMI 33.53 kg/m   Physical Exam Constitutional:      General: He is in acute distress.     Appearance: Normal appearance. He is ill-appearing and toxic-appearing.  HENT:     Head: Normocephalic and atraumatic.     Nose: No congestion or rhinorrhea.  Eyes:     General:        Right eye: No discharge.        Left eye: No discharge.     Extraocular Movements: Extraocular movements intact.     Pupils: Pupils are equal, round, and reactive to light.  Cardiovascular:     Rate and Rhythm: Regular rhythm. Bradycardia present.     Heart sounds: No murmur heard. Pulmonary:     Effort: Respiratory distress present.     Breath sounds: No wheezing or rales.  Abdominal:     General: There is no distension.     Tenderness: There is no abdominal tenderness.  Musculoskeletal:        General: Normal range of motion.     Cervical back: Normal range of motion.  Skin:    General: Skin is warm and dry.  Neurological:     General: No focal deficit present.     Mental Status: He is alert.     ED Results and Treatments Labs (all labs ordered are listed, but only abnormal results are displayed) Labs Reviewed  COMPREHENSIVE METABOLIC PANEL WITH GFR - Abnormal; Notable for the following components:      Result Value   Sodium 132 (*)    Glucose, Bld 247 (*)    All other components within normal limits  CBC WITH DIFFERENTIAL/PLATELET - Abnormal; Notable for the following components:   RBC 3.60 (*)    Hemoglobin 10.8 (*)    HCT 33.1 (*)    All other components within normal limits  CBG MONITORING, ED - Abnormal; Notable for the following components:   Glucose-Capillary 203 (*)    All other components within normal limits  RAPID URINE DRUG SCREEN, HOSP PERFORMED                                                                                                                           Radiology No results found.  Pertinent labs & imaging results that were available during my care of the patient were reviewed by me and considered in my medical decision making (see MDM for details).  Medications Ordered in ED Medications  naloxone (NARCAN) 2 MG/2ML injection (2 mg  Given 12/29/23 2059)  naloxone [facility]) 4 MG/0.1ML nasal spray kit (4 mg  Provided for home use 12/29/23 2059)  ondansetron  (ZOFRAN ) injection 4 mg (4 mg Intravenous Given 12/29/23 2101)  LORazepam (ATIVAN) injection 0.5 mg (0.5 mg Intravenous Given 12/29/23 2136)  diazepam (VALIUM) injection 5 mg (5 mg Intravenous Given 12/29/23 2256)  Procedures .Critical Care  Performed by: Karlyn Overman, MD Authorized by: Karlyn Overman, MD   Critical care provider statement:    Critical care time (minutes):  30   Critical care was necessary to treat or prevent imminent or life-threatening deterioration of the following conditions:  Toxidrome   Critical care was time spent personally by me on the following activities:  Development of treatment plan with patient or surrogate, discussions with consultants, evaluation of patient's response to treatment, examination of patient, ordering and review of laboratory studies, ordering and review of radiographic studies, ordering and performing treatments and interventions, pulse oximetry, re-evaluation of patient's condition and review of old charts   (including critical care time)  Medical Decision Making / ED Course   This patient presents to the ED for concern of opioid overdose, this involves an extensive number of treatment options, and is a complaint that carries with it a high risk of complications and morbidity.  The differential  diagnosis includes opioid overdose, fentanyl  overdose, coingestions, hypoglycemia  MDM: Patient seen emergency room for evaluation of an opioid overdose.  Physical exam reveals a very ill-appearing pale patient with agonal respirations.  Patient taken immediately to a room and given intranasal Narcan with minimal improvement.  Patient then given intravenous Narcan leading to the appropriate withdrawal constellation of symptoms and his mental status significant improved.  Laboratory evaluation with a hemoglobin of 10.8 but is otherwise unremarkable.  Patient requiring benzodiazepines to manage symptoms after opioid withdrawal from Narcan and is pending reevaluation by oncoming provider at time of now.  See progression of additional workup.  Anticipate discharge.   Additional history obtained: -Additional history obtained from partner -External records from outside source obtained and reviewed including: Chart review including previous notes, labs, imaging, consultation notes   Lab Tests: -I ordered, reviewed, and interpreted labs.   The pertinent results include:   Labs Reviewed  COMPREHENSIVE METABOLIC PANEL WITH GFR - Abnormal; Notable for the following components:      Result Value   Sodium 132 (*)    Glucose, Bld 247 (*)    All other components within normal limits  CBC WITH DIFFERENTIAL/PLATELET - Abnormal; Notable for the following components:   RBC 3.60 (*)    Hemoglobin 10.8 (*)    HCT 33.1 (*)    All other components within normal limits  CBG MONITORING, ED - Abnormal; Notable for the following components:   Glucose-Capillary 203 (*)    All other components within normal limits  RAPID URINE DRUG SCREEN, HOSP PERFORMED      EKG   EKG Interpretation Date/Time:  Friday Dec 29 2023 21:12:34 EDT Ventricular Rate:  41 PR Interval:  167 QRS Duration:  105 QT Interval:  519 QTC Calculation: 429 R Axis:   59  Text Interpretation: Sinus bradycardia Abnormal R-wave progression,  early transition Confirmed by Darcia Lampi ([id number]) on 12/30/2023 1:43:08 AM          Medicines ordered and prescription drug management: Meds ordered this encounter  Medications   naloxone (NARCAN) 2 MG/2ML injection    Emerson Hanly M: cabinet override   naloxone (NARCAN) 4 MG/0.1ML nasal spray kit    Avel Leiter, Emilee M: cabinet override   ondansetron  (ZOFRAN ) injection 4 mg   LORazepam (ATIVAN) injection 0.5 mg   diazepam (VALIUM) injection 5 mg   naloxone (NARCAN) nasal spray 4 mg/0.1 mL    Sig: Put into the nose as needed for opiate overdose    Dispense:  2 each  Refill:  5    -I have reviewed the patients home medicines and have made adjustments as needed  Critical interventions Narcan administration   Cardiac Monitoring: The patient was maintained on a cardiac monitor.  I personally viewed and interpreted the cardiac monitored which showed an underlying rhythm of: Sinus bradycardia  Social Determinants of Health:  Factors impacting patients care include: Opioid abuse   Reevaluation: After the interventions noted above, I reevaluated the patient and found that they have :improved  Co morbidities that complicate the patient evaluation  Past Medical History:  Diagnosis Date   Asthma    Diabetes mellitus without complication (HCC)    type 2   Hypertension    Rotator cuff disorder, left       Dispostion: I considered admission for this patient, and disposition pending reevaluation by oncoming provider.  Anticipate discharge     Final Clinical Impression(s) / ED Diagnoses Final diagnoses:  Opiate overdose, accidental or unintentional, initial encounter (HCC)  Hyponatremia  Normochromic normocytic anemia     @PCDICTATION @    Karlyn Overman, MD 12/30/23 [id number]

## 2024-01-01 ENCOUNTER — Other Ambulatory Visit: Payer: Self-pay | Admitting: Internal Medicine

## 2024-01-24 ENCOUNTER — Ambulatory Visit: Admitting: Nurse Practitioner

## 2024-01-24 ENCOUNTER — Other Ambulatory Visit: Payer: Self-pay | Admitting: Internal Medicine

## 2024-01-24 ENCOUNTER — Encounter: Payer: Self-pay | Admitting: Nurse Practitioner

## 2024-01-24 VITALS — BP 100/64 | HR 94 | Ht 70.0 in | Wt 226.0 lb

## 2024-01-24 DIAGNOSIS — Z7984 Long term (current) use of oral hypoglycemic drugs: Secondary | ICD-10-CM

## 2024-01-24 DIAGNOSIS — E1165 Type 2 diabetes mellitus with hyperglycemia: Secondary | ICD-10-CM | POA: Diagnosis not present

## 2024-01-24 DIAGNOSIS — Z794 Long term (current) use of insulin: Secondary | ICD-10-CM

## 2024-01-24 DIAGNOSIS — I10 Essential (primary) hypertension: Secondary | ICD-10-CM

## 2024-01-24 DIAGNOSIS — Z7985 Long-term (current) use of injectable non-insulin antidiabetic drugs: Secondary | ICD-10-CM

## 2024-01-24 DIAGNOSIS — E559 Vitamin D deficiency, unspecified: Secondary | ICD-10-CM

## 2024-01-24 DIAGNOSIS — E782 Mixed hyperlipidemia: Secondary | ICD-10-CM

## 2024-01-24 MED ORDER — BASAGLAR KWIKPEN 100 UNIT/ML ~~LOC~~ SOPN: 16.0000 [IU] | PEN_INJECTOR | Freq: Every day | SUBCUTANEOUS | 3 refills | Status: DC

## 2024-01-24 MED ORDER — METFORMIN HCL 500 MG PO TABS: 500.0000 mg | ORAL_TABLET | Freq: Two times a day (BID) | ORAL | 1 refills | Status: DC

## 2024-01-24 MED ORDER — INSULIN LISPRO (1 UNIT DIAL) 100 UNIT/ML (KWIKPEN)
5.0000 [IU] | PEN_INJECTOR | Freq: Three times a day (TID) | SUBCUTANEOUS | 3 refills | Status: AC
Start: 2024-01-24 — End: ?

## 2024-01-24 MED ORDER — PEN NEEDLES 31G X 8 MM MISC: 3 refills | Status: DC

## 2024-01-24 NOTE — Progress Notes (Signed)
 Endocrinology Follow Up Note       01/24/2024, 4:34 PM   Subjective:    Patient ID: Dustin Moore, male    DOB: 1960-10-20.  [name] is being seen in follow up after being seen in consultation for management of currently uncontrolled symptomatic diabetes requested by  Meldon Sport, MD.   Past Medical History:  Diagnosis Date   Asthma    Diabetes mellitus without complication (HCC)    type 2   Hypertension    Rotator cuff disorder, left     Past Surgical History:  Procedure Laterality Date   ROTATOR CUFF REPAIR Left 08/2021   TONSILLECTOMY AND ADENOIDECTOMY     TOOTH EXTRACTION N/A 12/15/2023   Procedure: Dental extractions of two, six, seven, eight, nine, eleven, thirteen, fifteen, twenty, twenty-one, twenty-two, twenty-three, twenty-four, twenty-five, twenty-six, twenty-seven, twenty-eight, twenty-nine, aveoloplasty, removal of bilateral mandibular lingual tori;  Surgeon: Ascencion Lava, DMD;  Location: [facility] OR;  Service: Oral Surgery;  Laterality: N/A;   TOTAL HIP ARTHROPLASTY Left 2013    Social History   Socioeconomic History   Marital status: Single    Spouse name: Not on file   Number of children: Not on file   Years of education: Not on file   Highest education level: Not on file  Occupational History   Not on file  Tobacco Use   Smoking status: Every Day    Current packs/day: 0.50    Average packs/day: 0.5 packs/day for 39.0 years (19.5 ttl pk-yrs)    Types: Cigarettes   Smokeless tobacco: Never   Tobacco comments:    5 cigarettes per day as of 12/25/23  Vaping Use   Vaping status: Never Used  Substance and Sexual Activity   Alcohol use: Not Currently    Comment: occasinally on weekends    Drug use: Not Currently    Types: Marijuana, Cocaine    Comment: last percocet use 01/2022 (no MJ or cocaine since he was in his 30s)   Sexual activity: Yes  Other Topics Concern   Not on file   Social History Narrative   Not on file   Social Drivers of Health   Financial Resource Strain: Not on File (03/28/2018)   Received from [employer]    Financial Resource Strain: 0  Food Insecurity: Not on File (05/04/2023)   Received from [facility] Insecurity    Food: 0  Transportation Needs: Not on File (03/28/2018)   Received from [facility] [employer] Needs    Transportation: 0  Physical Activity: Not on File (03/28/2018)   Received from [facility]   Physical Activity    Physical Activity: 0  Stress: Not on File (03/28/2018)   Received from [facility]   Stress    Stress: 0  Social Connections: Not on File (04/27/2023)   Received from [facility]   Social Connections    Connectedness: 0    Family History  Problem Relation Age of Onset   Diabetes Mother    Cirrhosis Mother    Heart disease Father    Heart attack Father    Heart disease Paternal Uncle     Outpatient Encounter Medications as  of 01/24/2024  Medication Sig   aspirin EC 81 MG tablet Take 81 mg by mouth daily.   B Complex-C (B-COMPLEX WITH VITAMIN C) tablet Take 1 tablet by mouth daily.   DULoxetine (CYMBALTA) 60 MG capsule TAKE 1 CAPSULE BY MOUTH DAILY   furosemide  (LASIX ) 40 MG tablet Take 1 tablet (40 mg total) by mouth daily.   ibuprofen (ADVIL) 200 MG tablet Take 400 mg by mouth every 8 (eight) hours as needed for moderate pain (pain score 4-6).   ipratropium-albuterol  (DUONEB) 0.5-2.5 (3) MG/3ML SOLN USE 1 VIAL VIA NEBULIZER EVERY 4 HOURS AS NEEDED FOR WHEEZING OR SHORTNESS OF BREATH.   losartan-hydrochlorothiazide (HYZAAR) 50-12.5 MG tablet TAKE 1 TABLET BY MOUTH DAILY   MAGNESIUM PO Take 500 mg by mouth daily.   naloxone  (NARCAN ) nasal spray 4 mg/0.1 mL Put into the nose as needed for opiate overdose   nicotine  (NICODERM CQ ) 14 mg/24hr patch Place 1 patch (14 mg total) onto the skin daily.   oxyCODONE -acetaminophen  (PERCOCET) 5-325 MG tablet Take 1 tablet by mouth every 4 (four) hours as  needed.   rosuvastatin  (CRESTOR ) 5 MG tablet TAKE 1 TABLET(5 MG) BY MOUTH EVERY EVENING   SYMBICORT  160-4.5 MCG/ACT inhaler Inhale 2 puffs into the lungs 2 (two) times daily. in the morning and at bedtime. (Patient taking differently: Inhale 2 puffs into the lungs 2 (two) times daily. in the morning and at bedtime. Patient states that he takes on as needed basis)   VENTOLIN  HFA 108 (90 Base) MCG/ACT inhaler Inhale 1-2 puffs into the lungs every 6 (six) hours as needed for wheezing or shortness of breath.   [DISCONTINUED] Insulin  Glargine (BASAGLAR  KWIKPEN) 100 UNIT/ML Inject 25 Units into the skin at bedtime.   [DISCONTINUED] insulin  lispro (HUMALOG  KWIKPEN) 100 UNIT/ML KwikPen Inject 8-14 Units into the skin 3 (three) times daily.   [DISCONTINUED] Insulin  Pen Needle (PEN NEEDLES) 31G X 8 MM MISC Use to inject insulin  4 times daily   [DISCONTINUED] metFORMIN  (GLUCOPHAGE ) 500 MG tablet Take 1 tablet (500 mg total) by mouth 2 (two) times daily with a meal.   Continuous Glucose Sensor (DEXCOM G7 SENSOR) MISC Inject 1 Application into the skin as directed. Change sensor every 10 days as directed.   Insulin  Glargine (BASAGLAR  KWIKPEN) 100 UNIT/ML Inject 16 Units into the skin at bedtime.   insulin  lispro (HUMALOG  KWIKPEN) 100 UNIT/ML KwikPen Inject 5-11 Units into the skin 3 (three) times daily.   Insulin  Pen Needle (PEN NEEDLES) 31G X 8 MM MISC Use to inject insulin  4 times daily   metFORMIN  (GLUCOPHAGE ) 500 MG tablet Take 1 tablet (500 mg total) by mouth 2 (two) times daily with a meal.   Omega 3-6-9 Fatty Acids (TRIPLE OMEGA COMPLEX PO) Take 1 capsule by mouth daily.   Potassium 99 MG TABS Take 99 mg by mouth daily.   UNABLE TO FIND 1 each by Does not apply route daily. Med Name: NEBULIZER   [DISCONTINUED] losartan-hydrochlorothiazide (HYZAAR) 50-12.5 MG tablet TAKE 1 TABLET BY MOUTH DAILY   No facility-administered encounter medications on file as of 01/24/2024.    ALLERGIES: Allergies  Allergen  Reactions   Contrast Media [Iodinated Contrast Media] Anaphylaxis    VACCINATION STATUS: Immunization History  Administered Date(s) Administered   Fluzone Influenza virus vaccine,trivalent (IIV3), split virus 05/01/2013, 07/01/2015, 07/13/2017, 07/02/2018   Influenza, Seasonal, Injecte, Preservative Fre 06/13/2023   Influenza,inj,Quad PF,6+ Mos 07/08/2020   PFIZER(Purple Top)SARS-COV-2 Vaccination 11/14/2019, 12/06/2019   Zoster Recombinant(Shingrix ) 06/13/2023    Diabetes  He presents for his follow-up diabetic visit. He has type 2 diabetes mellitus. Onset time: diagnosed at approx age of 49. His disease course has been improving. Hypoglycemia symptoms include nervousness/anxiousness, sweats and tremors. There are no diabetic associated symptoms. Hypoglycemia complications include nocturnal hypoglycemia. Symptoms are stable. 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 Risk factors for coronary artery disease include diabetes mellitus, dyslipidemia, family history, obesity, male sex, hypertension, sedentary lifestyle and tobacco exposure. Current diabetic treatment includes intensive insulin  program and oral agent (monotherapy). He is compliant with treatment most of the time. His weight is fluctuating minimally. He is following a generally healthy diet. When asked about meal planning, he reported none. He has not had a previous visit with a dietitian. He participates in exercise intermittently. His home blood glucose trend is decreasing steadily. His overall blood glucose range is 140-180 mg/dl. (He presents today, accompanied by his wife, with his CGM showing greatly improved glycemic profile.  His POCT A1c today is 7.4%, improving from last visit of 7.7%.  Analysis of his CGM shows TIR 74%, TAR 26%, TBR 0% with a GMI of 7.1%.  He does report getting woken up by his CGM on several occasions for low readings, for which he drinks a glass of OJ.  ) An ACE inhibitor/angiotensin  II receptor blocker is being taken. He does not see a podiatrist.Eye exam is not current.     Review of systems  Constitutional: + decreasing body weight, current Body mass index is 32.43 kg/m., no fatigue, no subjective hyperthermia, no subjective hypothermia, recent dental extractions- has dentures but waiting to completely heal before using them Eyes: no blurry vision, no xerophthalmia ENT: no sore throat, no nodules palpated in throat, no dysphagia/odynophagia, no hoarseness Cardiovascular: no chest pain, no shortness of breath, no palpitations, no leg swelling Respiratory: no cough, no shortness of breath Gastrointestinal: no nausea/vomiting/diarrhea Musculoskeletal: no muscle/joint aches Skin: no rashes, no hyperemia Neurological: no tremors, no numbness, no tingling, no dizziness Psychiatric: no depression, no anxiety  Objective:     BP 100/64 (BP Location: Right Arm, Patient Position: Sitting, Cuff Size: Large)   Pulse 94   Ht 5' 10 (1.778 m)   Wt 226 lb (102.5 kg)   BMI 32.43 kg/m   Wt Readings from Last 3 Encounters:  01/24/24 226 lb (102.5 kg)  12/29/23 233 lb 11 oz (106 kg)  12/25/23 235 lb 6.4 oz (106.8 kg)     BP Readings from Last 3 Encounters:  01/24/24 100/64  12/30/23 135/61  12/25/23 130/71      Physical Exam- Limited  Constitutional:  Body mass index is 32.43 kg/m. , not in acute distress, normal state of mind Eyes:  EOMI, no exophthalmos Musculoskeletal: no gross deformities, strength intact in all four extremities, no gross restriction of joint movements Skin:  no rashes, no hyperemia Neurological: no tremor with outstretched hands  Diabetic Foot Exam - Simple   No data filed      CMP ( most recent) CMP     Component Value Date/Time   NA 132 (L) 12/29/2023 2107   NA 139 12/25/2023 1356   K 4.4 12/29/2023 2107   CL 98 12/29/2023 2107   CO2 24 12/29/2023 2107   GLUCOSE 247 (H) 12/29/2023 2107   BUN 16 12/29/2023 2107   BUN 13  12/25/2023 1356   CREATININE 1.24 12/29/2023 2107   CALCIUM  9.4 12/29/2023 2107   PROT 7.5 12/29/2023 2107   PROT 6.5 12/25/2023 1356   ALBUMIN 4.1  12/29/2023 2107   ALBUMIN 4.2 12/25/2023 1356   AST 30 12/29/2023 2107   ALT 25 12/29/2023 2107   ALKPHOS 81 12/29/2023 2107   BILITOT 0.2 12/29/2023 2107   BILITOT <0.2 12/25/2023 1356   EGFR 60 12/25/2023 1356   GFRNONAA >60 12/29/2023 2107     Diabetic Labs (most recent): Lab Results  Component Value Date   HGBA1C 7.7 (A) 10/12/2023   HGBA1C 8.2 (A) 06/16/2023   HGBA1C 9.6 (A) 03/14/2023     Lipid Panel ( most recent) Lipid Panel     Component Value Date/Time   CHOL 129 12/25/2023 1356   TRIG 156 (H) 12/25/2023 1356   HDL 37 (L) 12/25/2023 1356   CHOLHDL 3.5 12/25/2023 1356   LDLCALC 65 12/25/2023 1356   LABVLDL 27 12/25/2023 1356      Lab Results  Component Value Date   TSH 1.820 06/13/2023           Assessment & Plan:   1) Type 2 diabetes mellitus with hyperglycemia, with long-term current use of insulin  (HCC)  He presents today, accompanied by his wife, with his CGM showing greatly improved glycemic profile.  His POCT A1c today is 7.4%, improving from last visit of 7.7%.  Analysis of his CGM shows TIR 74%, TAR 26%, TBR 0% with a GMI of 7.1%.  He does report getting woken up by his CGM on several occasions for low readings, for which he drinks a glass of OJ.    - Jathniel Batdorf has currently uncontrolled symptomatic type 2 DM since 63 years of age.   -Recent labs reviewed.  - I had a long discussion with him about the progressive nature of diabetes and the pathology behind its complications. -his diabetes is complicated by neuropathy and mild CKD and he remains at a high risk for more acute and chronic complications which include CAD, CVA, CKD, retinopathy, and neuropathy. These are all discussed in detail with him.  The following Lifestyle Medicine recommendations according to American College of Lifestyle  Medicine [facility]) were discussed and offered to patient and he agrees to start the journey:  A. Whole Foods, Plant-based plate comprising of fruits and vegetables, plant-based proteins, whole-grain carbohydrates was discussed in detail with the patient.   A list for source of those nutrients were also provided to the patient.  Patient will use only water  or unsweetened tea for hydration. B.  The need to stay away from risky substances including alcohol, smoking; obtaining 7 to 9 hours of restorative sleep, at least 150 minutes of moderate intensity exercise weekly, the importance of healthy social connections,  and stress reduction techniques were discussed. C.  A full color page of  Calorie density of various food groups per pound showing examples of each food groups was provided to the patient.  - Nutritional counseling repeated at each appointment due to patients tendency to fall back in to old habits.  - The patient admits there is a room for improvement in their diet and drink choices. -  Suggestion is made for the patient to avoid simple carbohydrates from their diet including Cakes, Sweet Desserts / Pastries, Ice Cream, Soda (diet and regular), Sweet Tea, Candies, Chips, Cookies, Sweet Pastries, Store Bought Juices, Alcohol in Excess of 1-2 drinks a day, Artificial Sweeteners, Coffee Creamer, and Sugar-free Products. This will help patient to have stable blood glucose profile and potentially avoid unintended weight gain.   - I encouraged the patient to switch to unprocessed or minimally processed complex  starch and increased protein intake (animal or plant source), fruits, and vegetables.   - Patient is advised to stick to a routine mealtimes to eat 3 meals a day and avoid unnecessary snacks (to snack only to correct hypoglycemia).  - he will be scheduled with Melva Stabile, RDN, CDE for diabetes education.  - I have approached him with the following individualized plan to manage his diabetes  and patient agrees:   -He is advised to lower his Basaglar  to 16 units SQ nightly and lower his Humalog  to 5-11 units TID with meals if glucose is above 90 and he is eating (Specific instructions on how to titrate insulin  dosage based on glucose readings given to patient in writing). He can also continue his Metformin  500 mg po twice daily after meals.  -he is encouraged to continue monitoring glucose 4 times daily (using his CGM), before meals and before bed, and to call the clinic if he has readings less than 70 or above 300 for 3 tests in a row.    - he is warned not to take insulin  without proper monitoring per orders. - Adjustment parameters are given to him for hypo and hyperglycemia in writing. - he is encouraged to call clinic for blood glucose levels less than 70 or above 300 mg /dl.  -He did not tolerate GLP1 in the past (he is high risk for development of pancreatitis on such medication given heavy smoking history).  - Specific targets for  A1c; LDL, HDL, and Triglycerides were discussed with the patient.  2) Blood Pressure /Hypertension:  his blood pressure is controlled to target.   he is advised to continue his current medications including Losartan 50 mg po daily and hydrochlorothiazide 12.5 mg p.o. daily with breakfast.  3) Lipids/Hyperlipidemia:    Review of his recent lipid panel from 06/13/23 showed controlled LDL at 86. He is currently on Crestor  5 mg po daily.  4)  Weight/Diet:  his Body mass index is 32.43 kg/m.  -  clearly complicating his diabetes care.   he is a candidate for weight loss. I discussed with him the fact that loss of 5 - 10% of his  current body weight will have the most impact on his diabetes management.  Exercise, and detailed carbohydrates information provided  -  detailed on discharge instructions.  5) Chronic Care/Health Maintenance: -he is on ACEI/ARB and not on Statin medications and is encouraged to initiate and continue to follow up with  Ophthalmology, Dentist, Podiatrist at least yearly or according to recommendations, and advised to QUIT SMOKING. I have recommended yearly flu vaccine and pneumonia vaccine at least every 5 years; moderate intensity exercise for up to 150 minutes weekly; and sleep for at least 7 hours a day.  - he is advised to maintain close follow up with Meldon Sport, MD for primary care needs, as well as his other providers for optimal and coordinated care.     I spent  42  minutes in the care of the patient today including review of labs from CMP, Lipids, Thyroid Function, Hematology (current and previous including abstractions from other facilities); face-to-face time discussing  his blood glucose readings/logs, discussing hypoglycemia and hyperglycemia episodes and symptoms, medications doses, his options of short and long term treatment based on the latest standards of care / guidelines;  discussion about incorporating lifestyle medicine;  and documenting the encounter. Risk reduction counseling performed per USPSTF guidelines to reduce obesity and cardiovascular risk factors.  Please refer to Patient Instructions for Blood Glucose Monitoring and Insulin /Medications Dosing Guide  in media tab for additional information. Please  also refer to  Patient Self Inventory in the Media  tab for reviewed elements of pertinent patient history.  Gagan Railsback participated in the discussions, expressed understanding, and voiced agreement with the above plans.  All questions were answered to his satisfaction. he is encouraged to contact clinic should he have any questions or concerns prior to his return visit.     Follow up plan: - Return in about 4 months (around 05/25/2024) for Diabetes F/U with A1c in office, No previsit labs, Bring meter and logs.   Hulon Magic, [facility] [address] [place], [place] [address] Phone: [phone] Fax: [phone]  01/24/2024,  4:34 PM

## 2024-01-26 ENCOUNTER — Encounter: Payer: Self-pay | Admitting: Nurse Practitioner

## 2024-01-28 ENCOUNTER — Other Ambulatory Visit: Payer: Self-pay | Admitting: Internal Medicine

## 2024-02-02 ENCOUNTER — Other Ambulatory Visit: Payer: Self-pay | Admitting: Internal Medicine

## 2024-02-02 ENCOUNTER — Ambulatory Visit: Payer: Self-pay

## 2024-02-02 NOTE — Telephone Encounter (Signed)
 FYI Only or Action Required?: FYI only for provider.  Patient was last seen in primary care on 12/25/2023 by Tobie Suzzane POUR, MD. Called Nurse Triage reporting Increase med dose request. Symptoms began chronic. Interventions attempted: Prescription medications: cymbalta. Symptoms are: gradually worsening.  Triage Disposition: See PCP When Office is Open (Within 3 Days)  Patient/caregiver understands and will follow disposition?: Yes  Reason for Disposition  Prescription request for new medicine (not a refill)  Answer Assessment - Initial Assessment Questions 1. NAME of MEDICINE: What medicine(s) are you calling about?     Cymbalta and Trazodone and Zofran  2. QUESTION: What is your question? (e.g., double dose of medicine, side effect)     Can I increase Cymbalta dosage rx by pcp, can you send new script for trazodone rx by previous pcp and zofran  rx by ortho surgeon?      3. SYMPTOMS: Do you have any symptoms? If Yes, ask: What symptoms are you having?  How bad are the symptoms (e.g., mild, moderate, severe)     Neuropathy pain-Cymbalta not working as well as it used too, wears off by mid day.      Trouble sleeping-uses 25-50mg  trazodone prn     Nausea-occasional, would like to have on hand for when needed.    Additional info: Has been to endo recently and talked about neuropathy they recommended to keep with cymbalta, speak with PCP about increasing dose.   Also asking Trazodone for sleep, rx by previous pcp in [place]. He is asking for new script for this. Trazodone 50mg  q nightly PRN, he states he takes only 1/2 tab and requesting script for trazodone 25mg  nightly prn.  Ondansetron  4mg  PRN Dr. Heyward who did shoulder surgery. Doesn't use often but would like refill for when he needs them.  Follow up office visit scheduled with pcp to discuss med requests  Protocols used: Medication Question Call-A-AH

## 2024-02-02 NOTE — Telephone Encounter (Signed)
 02/02/24 12:20-1st attempt [email or web address]  Copied from CRM #[id number]. Topic: Clinical - Medication Question >> Feb 02, 2024 10:43 AM Avram MATSU wrote: Reason for CRM: patient is calling about DULoxetine (CYMBALTA) 60 MG capsule [[id number]] he stated its not working and would like to increase the dose because its not as strong as use to be. His feet this flares up/feet and ankle.  Please call patient [phone] (M)

## 2024-02-02 NOTE — Telephone Encounter (Unsigned)
 Copied from CRM [id number]. Topic: Clinical - Medical Advice >> Feb 02, 2024  3:18 PM Tiffini S wrote: Reason for CRM: Patient spouse return missed call to triage nurse Nat asking for update about DULoxetine (CYMBALTA) 60 MG capsule for the patient feet this flares up/feet and ankle. Please call patient [phone] and advise.

## 2024-02-05 ENCOUNTER — Ambulatory Visit: Payer: Self-pay | Admitting: Internal Medicine

## 2024-02-05 ENCOUNTER — Encounter: Payer: Self-pay | Admitting: Internal Medicine

## 2024-02-05 VITALS — BP 96/62 | HR 85 | Wt 221.4 lb

## 2024-02-05 DIAGNOSIS — Z794 Long term (current) use of insulin: Secondary | ICD-10-CM | POA: Diagnosis not present

## 2024-02-05 DIAGNOSIS — F5101 Primary insomnia: Secondary | ICD-10-CM | POA: Insufficient documentation

## 2024-02-05 DIAGNOSIS — F1120 Opioid dependence, uncomplicated: Secondary | ICD-10-CM | POA: Diagnosis not present

## 2024-02-05 DIAGNOSIS — E1140 Type 2 diabetes mellitus with diabetic neuropathy, unspecified: Secondary | ICD-10-CM | POA: Diagnosis not present

## 2024-02-05 DIAGNOSIS — R110 Nausea: Secondary | ICD-10-CM

## 2024-02-05 MED ORDER — ONDANSETRON HCL 4 MG PO TABS: 4.0000 mg | ORAL_TABLET | Freq: Three times a day (TID) | ORAL | 0 refills | Status: DC | PRN

## 2024-02-05 MED ORDER — GABAPENTIN 300 MG PO CAPS: 300.0000 mg | ORAL_CAPSULE | Freq: Every day | ORAL | 3 refills | Status: DC

## 2024-02-05 MED ORDER — TRAZODONE HCL 50 MG PO TABS: 25.0000 mg | ORAL_TABLET | Freq: Every evening | ORAL | 3 refills | Status: AC | PRN

## 2024-02-05 NOTE — Progress Notes (Signed)
 Established Patient Office Visit  Subjective:  Patient ID: Dustin Moore, male    DOB: 02/04/1961  Age: 63 y.o. MRN: [id number]  CC:  Chief Complaint  Patient presents with   Medical Management of Chronic Issues    Would like to discuss medication duloxetine, states its not as effective as before.    HPI Dustin Moore is a 63 y.o. male with past medical history of HTN, type II DM, HLD, asthma, obesity and tobacco abuse who presents for f/u of his neuropathy.  DM neuropathy: He takes Cymbalta 60 mg QD currently, which was effective initially.  He has recently noted worsening of burning pain of his feet.  Denies any recent injury. His last HbA1c was 7.7 in 03/25, which has been gradually improving.  Insomnia: He takes trazodone 50 mg PRN, which was filled by his previous PCP.  He requests refill of it.  He also requests refill of Zofran , that he takes rarely for nausea.  Denies dysphagia or odynophagia currently.  Past Medical History:  Diagnosis Date   Asthma    Diabetes mellitus without complication (HCC)    type 2   Hypertension    Rotator cuff disorder, left     Past Surgical History:  Procedure Laterality Date   ROTATOR CUFF REPAIR Left 08/2021   TONSILLECTOMY AND ADENOIDECTOMY     TOOTH EXTRACTION N/A 12/15/2023   Procedure: Dental extractions of two, six, seven, eight, nine, eleven, thirteen, fifteen, twenty, twenty-one, twenty-two, twenty-three, twenty-four, twenty-five, twenty-six, twenty-seven, twenty-eight, twenty-nine, aveoloplasty, removal of bilateral mandibular lingual tori;  Surgeon: Sheryle Hamilton, DMD;  Location: [facility] OR;  Service: Oral Surgery;  Laterality: N/A;   TOTAL HIP ARTHROPLASTY Left 2013    Family History  Problem Relation Age of Onset   Diabetes Mother    Cirrhosis Mother    Heart disease Father    Heart attack Father    Heart disease Paternal Uncle     Social History   Socioeconomic History   Marital status: Single    Spouse name: Not on file    Number of children: Not on file   Years of education: Not on file   Highest education level: Not on file  Occupational History   Not on file  Tobacco Use   Smoking status: Every Day    Current packs/day: 0.50    Average packs/day: 0.5 packs/day for 39.0 years (19.5 ttl pk-yrs)    Types: Cigarettes   Smokeless tobacco: Never   Tobacco comments:    5 cigarettes per day as of 12/25/23  Vaping Use   Vaping status: Never Used  Substance and Sexual Activity   Alcohol use: Not Currently    Comment: occasinally on weekends    Drug use: Not Currently    Types: Marijuana, Cocaine    Comment: last percocet use 01/2022 (no MJ or cocaine since he was in his 30s)   Sexual activity: Yes  Other Topics Concern   Not on file  Social History Narrative   Not on file   Social Drivers of Health   Financial Resource Strain: Not on File (03/28/2018)   Received from [facility]    Financial Resource Strain: 0  Food Insecurity: Not on File (05/04/2023)   Received from [facility] Insecurity    Food: 0  Transportation Needs: Not on File (03/28/2018)   Received from [facility] Needs    Transportation: 0  Physical Activity: Not on File (03/28/2018)  Received from [facility]   Physical Activity    Physical Activity: 0  Stress: Not on File (03/28/2018)   Received from [facility]   Stress    Stress: 0  Social Connections: Not on File (04/27/2023)   Received from [facility]   Social Connections    Connectedness: 0  Intimate Partner Violence: Not on file    Outpatient Medications Prior to Visit  Medication Sig Dispense Refill   aspirin EC 81 MG tablet Take 81 mg by mouth daily.     B Complex-C (B-COMPLEX WITH VITAMIN C) tablet Take 1 tablet by mouth daily.     Continuous Glucose Sensor (DEXCOM G7 SENSOR) MISC Inject 1 Application into the skin as directed. Change sensor every 10 days as directed. 9 each 3   DULoxetine (CYMBALTA) 60 MG capsule TAKE 1 CAPSULE BY MOUTH DAILY  30 capsule 0   furosemide  (LASIX ) 40 MG tablet Take 1 tablet (40 mg total) by mouth daily. 30 tablet 3   ibuprofen (ADVIL) 200 MG tablet Take 400 mg by mouth every 8 (eight) hours as needed for moderate pain (pain score 4-6).     Insulin  Glargine (BASAGLAR  KWIKPEN) 100 UNIT/ML Inject 16 Units into the skin at bedtime. 15 mL 3   insulin  lispro (HUMALOG  KWIKPEN) 100 UNIT/ML KwikPen Inject 5-11 Units into the skin 3 (three) times daily. 30 mL 3   Insulin  Pen Needle (PEN NEEDLES) 31G X 8 MM MISC Use to inject insulin  4 times daily 300 each 3   ipratropium-albuterol  (DUONEB) 0.5-2.5 (3) MG/3ML SOLN USE 1 VIAL VIA NEBULIZER EVERY 4 HOURS AS NEEDED FOR WHEEZING OR SHORTNESS OF BREATH. 1620 mL 2   losartan-hydrochlorothiazide (HYZAAR) 50-12.5 MG tablet TAKE 1 TABLET BY MOUTH DAILY 30 tablet 0   MAGNESIUM PO Take 500 mg by mouth daily.     metFORMIN  (GLUCOPHAGE ) 500 MG tablet Take 1 tablet (500 mg total) by mouth 2 (two) times daily with a meal. 180 tablet 1   naloxone  (NARCAN ) nasal spray 4 mg/0.1 mL Put into the nose as needed for opiate overdose 2 each 5   nicotine  (NICODERM CQ ) 14 mg/24hr patch Place 1 patch (14 mg total) onto the skin daily. 28 patch 0   Omega 3-6-9 Fatty Acids (TRIPLE OMEGA COMPLEX PO) Take 1 capsule by mouth daily.     Potassium 99 MG TABS Take 99 mg by mouth daily.     rosuvastatin  (CRESTOR ) 5 MG tablet TAKE 1 TABLET(5 MG) BY MOUTH EVERY EVENING 90 tablet 1   SYMBICORT  160-4.5 MCG/ACT inhaler Inhale 2 puffs into the lungs 2 (two) times daily. in the morning and at bedtime. (Patient taking differently: Inhale 2 puffs into the lungs 2 (two) times daily. in the morning and at bedtime. Patient states that he takes on as needed basis) 33 g 3   UNABLE TO FIND 1 each by Does not apply route daily. Med Name: NEBULIZER 1 each 0   VENTOLIN  HFA 108 (90 Base) MCG/ACT inhaler Inhale 1-2 puffs into the lungs every 6 (six) hours as needed for wheezing or shortness of breath. 54 g 3    oxyCODONE -acetaminophen  (PERCOCET) 5-325 MG tablet Take 1 tablet by mouth every 4 (four) hours as needed. 24 tablet 0   No facility-administered medications prior to visit.    Allergies  Allergen Reactions   Contrast Media [Iodinated Contrast Media] Anaphylaxis    ROS Review of Systems  Constitutional:  Negative for chills and fever.  HENT:  Positive for dental problem. Negative for  congestion and sore throat.   Eyes:  Negative for pain and discharge.  Respiratory:  Negative for cough and shortness of breath.   Cardiovascular:  Positive for leg swelling. Negative for chest pain and palpitations.  Gastrointestinal:  Negative for diarrhea, nausea and vomiting.  Endocrine: Negative for polydipsia and polyuria.  Genitourinary:  Negative for dysuria and hematuria.  Musculoskeletal:  Negative for neck pain and neck stiffness.  Skin:  Negative for rash.  Neurological:  Positive for numbness. Negative for dizziness, weakness and headaches.  Psychiatric/Behavioral:  Negative for agitation and behavioral problems.       Objective:    Physical Exam Vitals reviewed.  Constitutional:      General: He is not in acute distress.    Appearance: He is not diaphoretic.  HENT:     Head: Normocephalic and atraumatic.     Nose: Nose normal.     Mouth/Throat:     Mouth: Mucous membranes are moist.   Eyes:     General: No scleral icterus.    Extraocular Movements: Extraocular movements intact.    Cardiovascular:     Rate and Rhythm: Normal rate and regular rhythm.     Heart sounds: Normal heart sounds. No murmur heard. Pulmonary:     Breath sounds: Normal breath sounds. No wheezing or rales.   Musculoskeletal:     Cervical back: Neck supple. No tenderness.     Right lower leg: No edema.     Left lower leg: No edema.   Skin:    General: Skin is warm.     Findings: No rash.   Neurological:     General: No focal deficit present.     Mental Status: He is alert and oriented to  person, place, and time.     Cranial Nerves: No cranial nerve deficit.     Sensory: Sensory deficit (B/l feet) present.     Motor: No weakness.   Psychiatric:        Mood and Affect: Mood normal.        Behavior: Behavior normal.     BP 96/62   Pulse 85   Wt 221 lb 6.4 oz (100.4 kg)   SpO2 96%   BMI 31.77 kg/m  Wt Readings from Last 3 Encounters:  02/05/24 221 lb 6.4 oz (100.4 kg)  01/24/24 226 lb (102.5 kg)  12/29/23 233 lb 11 oz (106 kg)    Lab Results  Component Value Date   TSH 1.820 06/13/2023   Lab Results  Component Value Date   WBC 6.2 12/29/2023   HGB 10.8 (L) 12/29/2023   HCT 33.1 (L) 12/29/2023   MCV 91.9 12/29/2023   PLT 173 12/29/2023   Lab Results  Component Value Date   NA 132 (L) 12/29/2023   K 4.4 12/29/2023   CO2 24 12/29/2023   GLUCOSE 247 (H) 12/29/2023   BUN 16 12/29/2023   CREATININE 1.24 12/29/2023   BILITOT 0.2 12/29/2023   ALKPHOS 81 12/29/2023   AST 30 12/29/2023   ALT 25 12/29/2023   PROT 7.5 12/29/2023   ALBUMIN 4.1 12/29/2023   CALCIUM  9.4 12/29/2023   ANIONGAP 10 12/29/2023   EGFR 60 12/25/2023   Lab Results  Component Value Date   CHOL 129 12/25/2023   Lab Results  Component Value Date   HDL 37 (L) 12/25/2023   Lab Results  Component Value Date   LDLCALC 65 12/25/2023   Lab Results  Component Value Date   TRIG 156 (H) 12/25/2023  Lab Results  Component Value Date   CHOLHDL 3.5 12/25/2023   Lab Results  Component Value Date   HGBA1C 7.7 (A) 10/12/2023      Assessment & Plan:   Problem List Items Addressed This Visit       Endocrine   Type 2 diabetes mellitus with diabetic neuropathy, with long-term current use of insulin  (HCC) - Primary   Uncontrolled Continue Cymbalta 60 mg QD for now Added gabapentin 300 mg nightly      Relevant Medications   gabapentin (NEURONTIN) 300 MG capsule     Other   Primary insomnia   Well-controlled with trazodone 50 mg nightly, refilled Advised to be cautious  when he starts taking gabapentin, hold for 3 days while starting Gabapentin Sleep hygiene material provided      Relevant Medications   traZODone (DESYREL) 50 MG tablet   Methadone maintenance therapy patient (HCC)   He recently had fentanyl  overdose, reports being given by a friend as a pain medicine, but patient was unaware of it History of opioid dependence Followed by [facility] for methadone now      Other Visit Diagnoses       Nausea       Relevant Medications   ondansetron  (ZOFRAN ) 4 MG tablet       Meds ordered this encounter  Medications   traZODone (DESYREL) 50 MG tablet    Sig: Take 0.5-1 tablets (25-50 mg total) by mouth at bedtime as needed for sleep.    Dispense:  30 tablet    Refill:  3   gabapentin (NEURONTIN) 300 MG capsule    Sig: Take 1 capsule (300 mg total) by mouth at bedtime.    Dispense:  30 capsule    Refill:  3   ondansetron  (ZOFRAN ) 4 MG tablet    Sig: Take 1 tablet (4 mg total) by mouth every 8 (eight) hours as needed for nausea or vomiting.    Dispense:  20 tablet    Refill:  0    Follow-up: Return if symptoms worsen or fail to improve.    Suzzane MARLA Blanch, MD

## 2024-02-05 NOTE — Assessment & Plan Note (Addendum)
 Well-controlled with trazodone 50 mg nightly, refilled Advised to be cautious when he starts taking gabapentin, hold for 3 days while starting Gabapentin Sleep hygiene material provided

## 2024-02-05 NOTE — Assessment & Plan Note (Signed)
 He recently had fentanyl  overdose, reports being given by a friend as a pain medicine, but patient was unaware of it History of opioid dependence Followed by [facility] for methadone now

## 2024-02-05 NOTE — Telephone Encounter (Signed)
Pt scheduled for an appt today

## 2024-02-05 NOTE — Patient Instructions (Addendum)
 Please start taking Gabapentin 300 mg at bedtime for neuropathy. Please continue taking Cymbalta as prescribed.  Please take Trazodone as needed for insomnia.  Please maintain simple sleep hygiene. - Maintain dark and non-noisy environment in the bedroom. - Please use the bedroom for sleep and sexual activity only. - Do not use electronic devices in the bedroom. - Please take dinner at least 2 hours before bedtime. - Please avoid caffeinated products in the evening, including coffee, soft drinks. - Please try to maintain the regular sleep-wake cycle - Go to bed and wake up at the same time.  Please take Zofran  as needed for nausea.

## 2024-02-05 NOTE — Assessment & Plan Note (Signed)
 Uncontrolled Continue Cymbalta 60 mg QD for now Added gabapentin 300 mg nightly

## 2024-02-14 ENCOUNTER — Encounter: Payer: Self-pay | Admitting: Nurse Practitioner

## 2024-02-20 ENCOUNTER — Other Ambulatory Visit: Payer: Self-pay | Admitting: Internal Medicine

## 2024-02-24 ENCOUNTER — Encounter: Payer: Self-pay | Admitting: Nurse Practitioner

## 2024-02-27 ENCOUNTER — Telehealth: Payer: Self-pay

## 2024-02-27 ENCOUNTER — Other Ambulatory Visit: Payer: Self-pay | Admitting: Internal Medicine

## 2024-02-27 NOTE — Telephone Encounter (Signed)
 Copied from CRM [id number]. Topic: General - Call Back - No Documentation >> Feb 27, 2024  2:11 PM Wess RAMAN wrote: Reason for CRM: Patient would like to speak with Dr. Anthony nurse in regards to his kidney stones  Callback #: [phone]

## 2024-02-27 NOTE — Telephone Encounter (Signed)
Scheduled tomorrow at 3 pm.

## 2024-02-27 NOTE — Telephone Encounter (Signed)
 Copied from CRM [id number]. Topic: Appointments - Appointment Info/Confirmation >> Feb 27, 2024  3:24 PM Dustin Moore wrote: Patient/patient representative is calling for information regarding an appointment.  Dustin Moore forgot to mention that he is allergic contrast dye while he was speaking with Dustin Moore, CMA today

## 2024-02-28 ENCOUNTER — Encounter: Payer: Self-pay | Admitting: Internal Medicine

## 2024-02-28 ENCOUNTER — Ambulatory Visit: Admitting: Internal Medicine

## 2024-02-28 VITALS — BP 122/72 | HR 82 | Ht 70.0 in | Wt 227.0 lb

## 2024-02-28 DIAGNOSIS — Z87448 Personal history of other diseases of urinary system: Secondary | ICD-10-CM | POA: Insufficient documentation

## 2024-02-28 DIAGNOSIS — N401 Enlarged prostate with lower urinary tract symptoms: Secondary | ICD-10-CM | POA: Diagnosis not present

## 2024-02-28 DIAGNOSIS — N3000 Acute cystitis without hematuria: Secondary | ICD-10-CM

## 2024-02-28 DIAGNOSIS — R3911 Hesitancy of micturition: Secondary | ICD-10-CM

## 2024-02-28 DIAGNOSIS — N200 Calculus of kidney: Secondary | ICD-10-CM | POA: Insufficient documentation

## 2024-02-28 DIAGNOSIS — M7989 Other specified soft tissue disorders: Secondary | ICD-10-CM

## 2024-02-28 MED ORDER — TAMSULOSIN HCL 0.4 MG PO CAPS: 0.4000 mg | ORAL_CAPSULE | Freq: Every day | ORAL | 3 refills | Status: DC

## 2024-02-28 NOTE — Telephone Encounter (Signed)
Noted in his chart 

## 2024-02-28 NOTE — Assessment & Plan Note (Signed)
 Chronic urinary hesitancy and retention likely due to BPH Likely has overflow incontinence Started tamsulosin  Referred to urology
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
## 2024-02-28 NOTE — Assessment & Plan Note (Signed)
 His current symptoms are less likely related to urinary stones But considering his history of recurrent nephrolithiasis, will get US  of renal Check UA Prescribed tamsulosin  for concern for BPH
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
## 2024-02-28 NOTE — Assessment & Plan Note (Signed)
 Better now, it is likely due to chronic venous insufficiency Advised to perform leg elevation and use compression socks as tolerated Advised to cut down salt intake Lasix  as needed for persistent swelling

## 2024-02-28 NOTE — Patient Instructions (Signed)
 Please start taking Tamsulosin  as prescribed.  Please get US  of renal done as scheduled.  You are being referred to Urology.

## 2024-02-29 LAB — MICROSCOPIC EXAMINATION

## 2024-03-01 ENCOUNTER — Ambulatory Visit: Payer: Self-pay | Admitting: Internal Medicine

## 2024-03-01 ENCOUNTER — Other Ambulatory Visit: Payer: Self-pay | Admitting: "Endocrinology

## 2024-03-01 DIAGNOSIS — Z7985 Long-term (current) use of injectable non-insulin antidiabetic drugs: Secondary | ICD-10-CM

## 2024-03-01 DIAGNOSIS — Z794 Long term (current) use of insulin: Secondary | ICD-10-CM

## 2024-03-01 DIAGNOSIS — E1165 Type 2 diabetes mellitus with hyperglycemia: Secondary | ICD-10-CM

## 2024-03-01 DIAGNOSIS — Z7984 Long term (current) use of oral hypoglycemic drugs: Secondary | ICD-10-CM

## 2024-03-01 LAB — URINALYSIS, ROUTINE W REFLEX MICROSCOPIC
Bilirubin, UA: NEGATIVE
Glucose, UA: NEGATIVE
Ketones, UA: NEGATIVE
Nitrite, UA: NEGATIVE
Protein,UA: NEGATIVE
RBC, UA: NEGATIVE
Specific Gravity, UA: 1.011 (ref 1.005–1.030)
Urobilinogen, Ur: 0.2 mg/dL (ref 0.2–1.0)
pH, UA: 5.5 (ref 5.0–7.5)

## 2024-03-01 LAB — MICROSCOPIC EXAMINATION
Epithelial Cells (non renal): NONE SEEN /HPF (ref 0–10)
RBC, Urine: NONE SEEN /HPF (ref 0–2)
Renal Epithel, UA: NONE SEEN /LPF
WBC, UA: >30 /HPF — AB (ref 0–5)

## 2024-03-01 MED ORDER — SULFAMETHOXAZOLE-TRIMETHOPRIM 800-160 MG PO TABS: 1.0000 | ORAL_TABLET | Freq: Two times a day (BID) | ORAL | 0 refills | Status: DC

## 2024-03-01 NOTE — Addendum Note (Signed)
 Addended byBETHA TOBIE DOWNS on: 03/01/2024 07:56 AM   Modules accepted: Orders

## 2024-03-04 ENCOUNTER — Other Ambulatory Visit (HOSPITAL_COMMUNITY): Payer: Self-pay

## 2024-03-04 ENCOUNTER — Telehealth: Payer: Self-pay

## 2024-03-04 NOTE — Telephone Encounter (Signed)
 Pharmacy Patient Advocate Encounter   Received notification from CoverMyMeds that prior authorization for Dexcom G7 sensor is required/requested.   Insurance verification completed.   The patient is insured through [facility] .   Per test claim: Refill too soon. PA is not needed at this time. Medication was filled 02/19/24. Next eligible fill date is 03/13/24.   Current PA expired 03/29/24

## 2024-03-05 ENCOUNTER — Ambulatory Visit (HOSPITAL_COMMUNITY)

## 2024-03-06 ENCOUNTER — Telehealth: Payer: Self-pay

## 2024-03-06 ENCOUNTER — Encounter: Payer: Self-pay | Admitting: Internal Medicine

## 2024-03-06 NOTE — Telephone Encounter (Signed)
 Copied from CRM [id number]. Topic: Clinical - Medication Question >> Mar 06, 2024 12:28 PM Winona R wrote: Pt would like to know if he can increase his tamsulosin  (FLOMAX ) 0.4 MG CAPS capsule from 1 time a day to 2 times a day. Pt does not feel its working as well as it did when he first started taking it.

## 2024-03-07 ENCOUNTER — Telehealth: Payer: Self-pay

## 2024-03-07 ENCOUNTER — Other Ambulatory Visit: Payer: Self-pay | Admitting: Internal Medicine

## 2024-03-07 NOTE — Telephone Encounter (Signed)
 Duplicate encounter, message has been routed to provider

## 2024-03-07 NOTE — Telephone Encounter (Signed)
 Copied from CRM [id number]. Topic: Clinical - Medication Question >> Mar 06, 2024 12:28 PM Winona R wrote: Pt would like to know if he can increase his tamsulosin  (FLOMAX ) 0.4 MG CAPS capsule from 1 time a day to 2 times a day. Pt does not feel its working as well as it did when he first started taking it. >> Mar 07, 2024  2:01 PM Montie POUR wrote: Dustin Moore is calling back because he has not received a call about the above medication. Please call him back at [phone]. Thanks >> Mar 07, 2024 11:50 AM Carlyon D wrote: Pt would like to know if he can increase his tamsulosin  (FLOMAX ) 0.4 MG CAPS capsule from 1 time a day to 2 times a day. Pt does not feel its working as well as it did when he first started taking it.  Pt called again in regards to this medication question.

## 2024-03-08 ENCOUNTER — Telehealth: Payer: Self-pay | Admitting: Internal Medicine

## 2024-03-08 ENCOUNTER — Ambulatory Visit (HOSPITAL_COMMUNITY)
Admission: RE | Admit: 2024-03-08 | Discharge: 2024-03-08 | Disposition: A | Discharge status: Home / Self Care | Source: Ambulatory Visit | Attending: Internal Medicine | Admitting: Internal Medicine

## 2024-03-08 DIAGNOSIS — N200 Calculus of kidney: Secondary | ICD-10-CM | POA: Insufficient documentation

## 2024-03-08 NOTE — Telephone Encounter (Unsigned)
 Copied from CRM [id number]. Topic: Clinical - Medication Question >> Mar 08, 2024  2:52 PM Donee H wrote: Reason for CRM: Patient wants to ask a question regarding medication tamsulosin  (FLOMAX ) 0.4 MG CAPS capsule . Patient states not really working and wants to know if he can take 2 capsules instead of just 1. He states he was told someone would call him back 2 days ago but haven't had a follow up call. Requesting a callback please at [phone]

## 2024-03-10 ENCOUNTER — Other Ambulatory Visit: Payer: Self-pay | Admitting: Internal Medicine

## 2024-03-10 DIAGNOSIS — N401 Enlarged prostate with lower urinary tract symptoms: Secondary | ICD-10-CM

## 2024-03-10 DIAGNOSIS — N200 Calculus of kidney: Secondary | ICD-10-CM

## 2024-03-10 MED ORDER — TAMSULOSIN HCL 0.4 MG PO CAPS: 0.4000 mg | ORAL_CAPSULE | Freq: Two times a day (BID) | ORAL | 3 refills | Status: DC

## 2024-03-11 NOTE — Telephone Encounter (Signed)
 Patient advised.

## 2024-03-14 ENCOUNTER — Telehealth: Payer: Self-pay

## 2024-03-14 ENCOUNTER — Ambulatory Visit: Admitting: Urology

## 2024-03-14 ENCOUNTER — Other Ambulatory Visit: Payer: Self-pay | Admitting: Urology

## 2024-03-14 ENCOUNTER — Encounter: Payer: Self-pay | Admitting: Urology

## 2024-03-14 VITALS — BP 127/73 | HR 70

## 2024-03-14 DIAGNOSIS — N200 Calculus of kidney: Secondary | ICD-10-CM | POA: Diagnosis not present

## 2024-03-14 DIAGNOSIS — N401 Enlarged prostate with lower urinary tract symptoms: Secondary | ICD-10-CM | POA: Diagnosis not present

## 2024-03-14 DIAGNOSIS — R338 Other retention of urine: Secondary | ICD-10-CM | POA: Diagnosis not present

## 2024-03-14 DIAGNOSIS — T839XXA Unspecified complication of genitourinary prosthetic device, implant and graft, initial encounter: Secondary | ICD-10-CM

## 2024-03-14 DIAGNOSIS — J45909 Unspecified asthma, uncomplicated: Secondary | ICD-10-CM | POA: Insufficient documentation

## 2024-03-14 DIAGNOSIS — R3911 Hesitancy of micturition: Secondary | ICD-10-CM

## 2024-03-14 DIAGNOSIS — R339 Retention of urine, unspecified: Secondary | ICD-10-CM

## 2024-03-14 DIAGNOSIS — Z87448 Personal history of other diseases of urinary system: Secondary | ICD-10-CM

## 2024-03-14 DIAGNOSIS — Z87442 Personal history of urinary calculi: Secondary | ICD-10-CM

## 2024-03-14 LAB — URINALYSIS, ROUTINE W REFLEX MICROSCOPIC
Bilirubin, UA: NEGATIVE
Ketones, UA: NEGATIVE
Nitrite, UA: NEGATIVE
Protein,UA: NEGATIVE
RBC, UA: NEGATIVE
Specific Gravity, UA: 1.015 (ref 1.005–1.030)
Urobilinogen, Ur: 0.2 mg/dL (ref 0.2–1.0)
pH, UA: 6 (ref 5.0–7.5)

## 2024-03-14 LAB — MICROSCOPIC EXAMINATION

## 2024-03-14 LAB — BLADDER SCAN AMB NON-IMAGING: Scan Result: 952

## 2024-03-14 MED ORDER — ALFUZOSIN HCL ER 10 MG PO TB24: 10.0000 mg | ORAL_TABLET | Freq: Every day | ORAL | 11 refills | Status: DC

## 2024-03-14 MED ORDER — SILODOSIN 8 MG PO CAPS: 8.0000 mg | ORAL_CAPSULE | Freq: Every day | ORAL | 5 refills | Status: DC

## 2024-03-14 NOTE — Patient Instructions (Signed)
    Step 1 Get all of your supplies ready and place them near you. Step 2 Wash your hands with warm, soapy water or put on gloves. Step 3 Wash around the tip of your penis with warm, soapy water or a castille soap towelette. Step 4 Take catheter out of package and drain the lubricant over toilet. Step 5 Hold the penis at a 45 degree angle from the stomach in one hand and the catheter in the other hand. Step 6 Insert the catheter slowly into your urethra. If there is resistance when the catheter reaches the sphincter muscle,              take a deep breath and gently apply steady pressure.              DO NOT FORCE THE CATHETER Step 7 When the urine begins to flow, insert another inch and lower penis. Allow the urine to flow into the toilet. Step 8 When the flow of urine stops, slowly remove the catheter.

## 2024-03-14 NOTE — Telephone Encounter (Signed)
 Pt called to make us  aware that his Rx needs a PA pt advised we received the PA and it would be turned into his insurance
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
## 2024-03-15 NOTE — Progress Notes (Signed)
 Pt was taught CIC, patient had hematurias he tried to pass catheter through there was resisted and patient state's he did not want to do CIC anymore. NP advised patient to go to the ED to get foley catheter place since patient did not want Dustin Moore, CMA to place foley catheter in office.  Dyjwpvlj, CMA

## 2024-03-16 ENCOUNTER — Other Ambulatory Visit: Payer: Self-pay | Admitting: Internal Medicine

## 2024-03-16 DIAGNOSIS — E782 Mixed hyperlipidemia: Secondary | ICD-10-CM

## 2024-03-18 NOTE — Telephone Encounter (Unsigned)
 Copied from CRM [id number]. Topic: General - Call Back - No Documentation >> Mar 18, 2024  3:19 PM Marissa P wrote: Reason for CRM: Patient would like a callback from doctor Patels nurse in regards to his Urology appt please if he can get a call by the end of the day please Urgent  matter.

## 2024-03-19 ENCOUNTER — Telehealth: Payer: Self-pay

## 2024-03-19 NOTE — Telephone Encounter (Signed)
 Copied from CRM [id number]. Topic: General - Call Back - No Documentation >> Mar 18, 2024  3:19 PM Marissa P wrote: Reason for CRM: Patient would like a callback from doctor Patels nurse in regards to his Urology appt please if he can get a call by the end of the day please Urgent  matter.

## 2024-03-19 NOTE — Telephone Encounter (Signed)
 Please advise   Copied from CRM #[id number]. Topic: Referral - Question >> Mar 19, 2024  2:58 PM DeAngela L wrote: Reason for CRM: Patient is asking if Dr Tobie could give him a referral to a Urologist in [place] area and would like to ask that the provider is a doctor and not a PA  Patient is still having concern with not being able to urinate completely so he is trying to schedule Urology appt soon as possible  Pt num [phone] (M)

## 2024-03-20 ENCOUNTER — Other Ambulatory Visit: Payer: Self-pay | Admitting: Internal Medicine

## 2024-03-20 DIAGNOSIS — N401 Enlarged prostate with lower urinary tract symptoms: Secondary | ICD-10-CM

## 2024-03-20 DIAGNOSIS — N200 Calculus of kidney: Secondary | ICD-10-CM

## 2024-03-20 NOTE — Telephone Encounter (Signed)
 Pt asking for different location .

## 2024-03-20 NOTE — Telephone Encounter (Signed)
 Left detailed vm

## 2024-03-21 ENCOUNTER — Other Ambulatory Visit: Payer: Self-pay | Admitting: Internal Medicine

## 2024-03-27 ENCOUNTER — Other Ambulatory Visit: Payer: Self-pay

## 2024-03-27 ENCOUNTER — Ambulatory Visit: Admitting: Urology

## 2024-03-27 ENCOUNTER — Telehealth: Payer: Self-pay

## 2024-03-27 VITALS — BP 147/84 | HR 83 | Wt 234.2 lb

## 2024-03-27 DIAGNOSIS — N401 Enlarged prostate with lower urinary tract symptoms: Secondary | ICD-10-CM | POA: Diagnosis not present

## 2024-03-27 DIAGNOSIS — N35812 Other urethral bulbous stricture, male: Secondary | ICD-10-CM | POA: Diagnosis not present

## 2024-03-27 DIAGNOSIS — R3911 Hesitancy of micturition: Secondary | ICD-10-CM | POA: Diagnosis not present

## 2024-03-27 DIAGNOSIS — N35919 Unspecified urethral stricture, male, unspecified site: Secondary | ICD-10-CM

## 2024-03-27 LAB — URINALYSIS, COMPLETE
Bilirubin, UA: NEGATIVE
Glucose, UA: NEGATIVE
Ketones, UA: NEGATIVE
Nitrite, UA: POSITIVE — AB
Protein,UA: NEGATIVE
Specific Gravity, UA: 1.01 (ref 1.005–1.030)
Urobilinogen, Ur: 0.2 mg/dL (ref 0.2–1.0)
pH, UA: 6 (ref 5.0–7.5)

## 2024-03-27 LAB — MICROSCOPIC EXAMINATION

## 2024-03-27 LAB — BLADDER SCAN AMB NON-IMAGING

## 2024-03-27 MED ORDER — CIPROFLOXACIN HCL 500 MG PO TABS: 500.0000 mg | ORAL_TABLET | Freq: Two times a day (BID) | ORAL | 0 refills | Status: DC

## 2024-03-27 NOTE — Patient Instructions (Signed)
 Urethral Stricture  Urethral stricture is when the tube that drains pee (urine) from the bladder out of the body (urethra) becomes too narrow. The urethra can become narrow because of scar tissue, infection, surgery, or an injury. This can make it difficult to pee (urinate). In females, the urethra opens above the vaginal opening. In males, the urethra opens at the tip of the penis, and the urethra is much longer than it is in females. Because of the length of the male urethra, urethral stricture is much more common in males. What are the causes? In males and females, common causes of urethral stricture include: Urinary tract infection (UTI). Sexually transmitted infection (STI). Using a soft tube in the urethra to drain pee from the bladder (urinary catheter). Urinary tract surgery. In males, common causes of urethral stricture include: A severe injury to the pelvis. Prostate surgery. Injury to the penis. In many cases, the cause of urethral stricture is not known. What increases the risk? You are more likely to develop this condition if you: Are male. Males who have had prostate surgery are at risk of developing this condition. Use a urinary catheter. Have had urinary tract surgery. What are the signs or symptoms? The main symptom of this condition is trouble peeing. This may cause decreased pee flow, dribbling, or spraying of pee. Other symptom of this condition may include: Frequent UTIs. Blood in the pee. Pain when peeing. Swelling of the penis in males. Not being able to pee. How is this diagnosed? This condition may be diagnosed based on: Your medical history and a physical exam. Tests of your pee to check for infection or bleeding. X-rays. Ultrasound. Retrograde urethrogram. With this test, a dye is injected into the urethra and then an X-ray is taken. Urethroscopy. This is when a thin tube with a light and camera on the end (urethroscope) is used to look at the urethra. A  CT scan or MRI. How is this treated? This condition is treated with surgery or other procedures. The type of surgery that you have depends on the severity of your condition. You may have: Urethral dilation. In this procedure, the narrow part of the urethra is stretched open (dilated) with dilating instruments or a small balloon.  Optilume balloon dilation is a balloon coated with a medicine that can prevent recurrence of the stricture, this typically has an 80% long-term success rate Urethrotomy. In this procedure, a urethroscope is placed into the urethra, and the narrow part of the urethra is cut open with a surgical blade or laser inserted through the urethroscope. Urethroplasty. In this procedure, an incision is made in the urethra and the narrow part is removed. Then, the urethra is reconstructed. Follow these instructions at home: Take over-the-counter and prescription medicines only as told by your health care provider. If you were prescribed antibiotics, take them as told by your provider. Do not stop using the antibiotic even if you start to feel better. Drink enough fluid to keep your pee pale yellow. Keep all follow-up visits. Your provider will check your healing and adjust your treatment plan as needed. Contact a health care provider if: You have frequent peeing or you are only peeing small amounts often. You feel the need to pee urgently. You have pain or burning when you pee. Your pee smells bad or unusual. Your pee is bloody or cloudy. You have pain in your lower abdomen or back. Your genital area is swollen, bruised, or discolored. This includes: The penis, scrotum, and  inner thighs for males. The outer genital organs (vulva) and inner thighs for females. You have a fever. You develop swelling in your legs. Get help right away if: You cannot pee. You have trouble breathing. These symptoms may be an emergency. Get help right away. Call 911. Do not wait to see if the  symptoms will go away. Do not drive yourself to the hospital. This information is not intended to replace advice given to you by your health care provider. Make sure you discuss any questions you have with your health care provider. Document Revised: 05/19/2022 Document Reviewed: 05/19/2022 Elsevier Patient Education  2024 [employer].

## 2024-03-27 NOTE — Telephone Encounter (Signed)
 Per Dr. Twylla, Patient is to be scheduled for Cystoscopy with Urethral Balloon Dilation using Optilume and Retrograde Urethrogram   Dustin Moore was contacted and possible surgical dates were discussed, Thursday August 21st. 2025 was agreed upon for surgery.     Patient was directed to call [phone] between 1-3pm the day before surgery to find out surgical arrival time.  Instructions were given not to eat or drink from midnight on the night before surgery and have a driver for the day of surgery. On the surgery day patient was instructed to enter through the [facility] entrance of [facility] report the Same Day Surgery desk.   Pre-Admit Testing will be in contact via phone to set up an interview with the anesthesia team to review your history and medications prior to surgery.   Reminder of this information was sent via MyChart to the patient.

## 2024-03-27 NOTE — Progress Notes (Signed)
 Surgical Physician Order [facility]  * Scheduling expectation : Thursday 8/21 [place]  *Length of Case: 30 minutes  *Clearance needed: no  *Anticoagulation Instructions: Hold all anticoagulants  *Aspirin Instructions: Ok to continue Aspirin  *Post-op visit Date/Instructions:  1-3 day voiding trial (patient prefers Angie if they are able)  *Diagnosis: Urethral Stricture  *Procedure: Cystoscopy, retrograde urethrogram, Optilume balloon dilation   Additional orders: N/A  -Admit type: OUTpatient  -Anesthesia: General  -VTE Prophylaxis Standing Order SCD's       Other:   -Standing Lab Orders Per Anesthesia    Lab other: UA&Urine Culture sent 8/20  -Standing Test orders EKG/Chest x-ray per Anesthesia       Test other:   - Medications:  Cipro  400mg  IV  -Other orders:  N/A

## 2024-03-27 NOTE — Progress Notes (Signed)
   [facility]ing Form  Surgery Date: Date: 03/28/2024  Surgeon: Dr. Glendia Barba, MD  Inpt ( No  )   Outpt (Yes)   Obs ( No  )   Diagnosis: N35.919 Urethral Stricture  -CPT: 47715, [phone]  Surgery: Cystoscopy with Urethral Balloon Dilation using Optilume and Retrograde Urethrogram  Stop Anticoagulations: Yes, may continue ASA  Cardiac/Medical/Pulmonary Clearance needed: no  *Orders entered into EPIC  Date: 03/27/24   *Case booked in EPIC  Date: 03/27/24  *Notified pt of Surgery: Date: 03/27/24  PRE-OP UA & CX: yes, obtained in clinic today 03/27/2024  *Placed into Prior Authorization Work [place] Date: 03/27/24  Assistant/laser/rep:No

## 2024-03-27 NOTE — H&P (View-Only) (Signed)
 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  He was seen by the NP at [facility] on 03/14/2024 and PVR was , they were unable to place a catheter and urology MD not in clinic and he was recommended to follow-up for a cystoscopy.  He was trialed on Flomax  and Scilla dosing with minimal change in his urinary symptoms, PSA has been normal and less than 1.  Was recently treated for possible UTI with Bactrim , culture was not sent.  Urinalysis today appears infected with 11-30 WBC, 0 RBC, moderate bacteria, leukocytes 2+, nitrate positive.  He is on methadone.   PMH: Past Medical History:  Diagnosis Date   Asthma    Diabetes mellitus without complication (HCC)    type 2   Hypertension    Rotator cuff disorder, left     Surgical History: Past Surgical History:  Procedure Laterality Date   ROTATOR CUFF REPAIR Left 08/2021   TONSILLECTOMY AND ADENOIDECTOMY     TOOTH EXTRACTION N/A 12/15/2023   Procedure: Dental extractions of two, six, seven, eight, nine, eleven, thirteen, fifteen, twenty, twenty-one, twenty-two, twenty-three, twenty-four, twenty-five, twenty-six, twenty-seven, twenty-eight, twenty-nine, aveoloplasty, removal of bilateral mandibular lingual tori;  Surgeon: Sheryle Hamilton, DMD;  Location: [facility] OR;  Service: Oral Surgery;  Laterality: N/A;   TOTAL HIP ARTHROPLASTY Left 2013    Family History: Family History  Problem Relation Age of Onset   Diabetes Mother    Cirrhosis Mother    Heart disease Father    Heart attack Father    Heart disease Paternal Uncle     Social History:  reports that he has been smoking cigarettes. He has a 19.5 pack-year smoking  history. He has never used smokeless tobacco. He reports that he does not currently use alcohol. He reports that he does not currently use drugs after having used the following drugs: Marijuana and Cocaine.  Physical Exam: BP (!) 147/84 (BP Location: Left Arm, Patient Position: Sitting, Cuff Size: Large)   Pulse 83   Wt 234 lb 3.2 oz (106.2 kg)   SpO2 94%   BMI 33.60 kg/m    Constitutional:  Alert and oriented, No acute distress. Cardiovascular: No clubbing, cyanosis, or edema. Respiratory: Normal respiratory effort, no increased work of breathing. GI: Abdomen is soft, nontender, nondistended, no abdominal masses  Assessment & Plan:   63 year old male with distant history of complex urethroplasty for urethral stricture, urethral dilation approximately 20 years ago, with likely recurrence of urethral stricture.  He has not tolerated dilation in clinic previously.  No improvement on alpha blockers.  We discussed options including cystoscopy, bedside dilation today, or OR tomorrow for retrograde urethrogram, cystoscopy, and likely Optilume balloon dilation.  Risk of recurrence discussed, especially if stricture longer than 2 cm.  He is willing to undergo less invasive endoscopic management with high risk of recurrence as opposed to considering urethroplasty at this time, especially since he did well for 20 years after prior urethral dilation.  Risks of bleeding, infection, recurrence, temporary catheter placement, postoperative pain were reviewed at length.  Cipro  500 mg twice daily for suspected UTI, follow-up culture OR tomorrow with Dr. Twylla for retrograde urethrogram, Optilume balloon dilation of urethral stricture, cystoscopy   Redell  Francisca, MD 03/27/2024  [facility] [address], [address] [place], [place] [address] [phone]

## 2024-03-27 NOTE — Progress Notes (Signed)
 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  He was seen by the NP at [facility] on 03/14/2024 and PVR was , they were unable to place a catheter and urology MD not in clinic and he was recommended to follow-up for a cystoscopy.  He was trialed on Flomax  and Scilla dosing with minimal change in his urinary symptoms, PSA has been normal and less than 1.  Was recently treated for possible UTI with Bactrim , culture was not sent.  Urinalysis today appears infected with 11-30 WBC, 0 RBC, moderate bacteria, leukocytes 2+, nitrate positive.  He is on methadone.   PMH: Past Medical History:  Diagnosis Date   Asthma    Diabetes mellitus without complication (HCC)    type 2   Hypertension    Rotator cuff disorder, left     Surgical History: Past Surgical History:  Procedure Laterality Date   ROTATOR CUFF REPAIR Left 08/2021   TONSILLECTOMY AND ADENOIDECTOMY     TOOTH EXTRACTION N/A 12/15/2023   Procedure: Dental extractions of two, six, seven, eight, nine, eleven, thirteen, fifteen, twenty, twenty-one, twenty-two, twenty-three, twenty-four, twenty-five, twenty-six, twenty-seven, twenty-eight, twenty-nine, aveoloplasty, removal of bilateral mandibular lingual tori;  Surgeon: Sheryle Hamilton, DMD;  Location: [facility] OR;  Service: Oral Surgery;  Laterality: N/A;   TOTAL HIP ARTHROPLASTY Left 2013    Family History: Family History  Problem Relation Age of Onset   Diabetes Mother    Cirrhosis Mother    Heart disease Father    Heart attack Father    Heart disease Paternal Uncle     Social History:  reports that he has been smoking cigarettes. He has a 19.5 pack-year smoking  history. He has never used smokeless tobacco. He reports that he does not currently use alcohol. He reports that he does not currently use drugs after having used the following drugs: Marijuana and Cocaine.  Physical Exam: BP (!) 147/84 (BP Location: Left Arm, Patient Position: Sitting, Cuff Size: Large)   Pulse 83   Wt 234 lb 3.2 oz (106.2 kg)   SpO2 94%   BMI 33.60 kg/m    Constitutional:  Alert and oriented, No acute distress. Cardiovascular: No clubbing, cyanosis, or edema. Respiratory: Normal respiratory effort, no increased work of breathing. GI: Abdomen is soft, nontender, nondistended, no abdominal masses  Assessment & Plan:   63 year old male with distant history of complex urethroplasty for urethral stricture, urethral dilation approximately 20 years ago, with likely recurrence of urethral stricture.  He has not tolerated dilation in clinic previously.  No improvement on alpha blockers.  We discussed options including cystoscopy, bedside dilation today, or OR tomorrow for retrograde urethrogram, cystoscopy, and likely Optilume balloon dilation.  Risk of recurrence discussed, especially if stricture longer than 2 cm.  He is willing to undergo less invasive endoscopic management with high risk of recurrence as opposed to considering urethroplasty at this time, especially since he did well for 20 years after prior urethral dilation.  Risks of bleeding, infection, recurrence, temporary catheter placement, postoperative pain were reviewed at length.  Cipro  500 mg twice daily for suspected UTI, follow-up culture OR tomorrow with Dr. Twylla for retrograde urethrogram, Optilume balloon dilation of urethral stricture, cystoscopy   Redell  Francisca, MD 03/27/2024  [facility] [address], [address] [place], [place] [address] [phone]

## 2024-03-28 ENCOUNTER — Encounter: Payer: Self-pay | Admitting: Urology

## 2024-03-28 ENCOUNTER — Ambulatory Visit

## 2024-03-28 ENCOUNTER — Other Ambulatory Visit: Payer: Self-pay

## 2024-03-28 ENCOUNTER — Ambulatory Visit
Admission: RE | Admit: 2024-03-28 | Discharge: 2024-03-28 | Disposition: A | Discharge status: Home / Self Care | Attending: Urology | Admitting: Urology

## 2024-03-28 ENCOUNTER — Encounter
Admission: RE | Disposition: A | Discharge status: Home / Self Care | Payer: Self-pay | Source: Home / Self Care | Attending: Urology

## 2024-03-28 DIAGNOSIS — I10 Essential (primary) hypertension: Secondary | ICD-10-CM | POA: Diagnosis not present

## 2024-03-28 DIAGNOSIS — N35919 Unspecified urethral stricture, male, unspecified site: Secondary | ICD-10-CM

## 2024-03-28 DIAGNOSIS — F17200 Nicotine dependence, unspecified, uncomplicated: Secondary | ICD-10-CM | POA: Insufficient documentation

## 2024-03-28 DIAGNOSIS — E782 Mixed hyperlipidemia: Secondary | ICD-10-CM

## 2024-03-28 DIAGNOSIS — E119 Type 2 diabetes mellitus without complications: Secondary | ICD-10-CM | POA: Diagnosis not present

## 2024-03-28 DIAGNOSIS — J45909 Unspecified asthma, uncomplicated: Secondary | ICD-10-CM | POA: Diagnosis not present

## 2024-03-28 DIAGNOSIS — Z794 Long term (current) use of insulin: Secondary | ICD-10-CM | POA: Diagnosis not present

## 2024-03-28 DIAGNOSIS — E1140 Type 2 diabetes mellitus with diabetic neuropathy, unspecified: Secondary | ICD-10-CM

## 2024-03-28 DIAGNOSIS — N35811 Other urethral stricture, male, meatal: Secondary | ICD-10-CM | POA: Diagnosis present

## 2024-03-28 DIAGNOSIS — E1165 Type 2 diabetes mellitus with hyperglycemia: Secondary | ICD-10-CM

## 2024-03-28 DIAGNOSIS — Z01812 Encounter for preprocedural laboratory examination: Secondary | ICD-10-CM

## 2024-03-28 HISTORY — PX: CYSTOSCOPY WITH RETROGRADE URETHROGRAM: SHX6309

## 2024-03-28 LAB — CBC
HCT: 28.2 % — ABNORMAL LOW (ref 39.0–52.0)
Hemoglobin: 8.9 g/dL — ABNORMAL LOW (ref 13.0–17.0)
MCH: 30.1 pg (ref 26.0–34.0)
MCHC: 31.6 g/dL (ref 30.0–36.0)
MCV: 95.3 fL (ref 80.0–100.0)
Platelets: 113 K/uL — ABNORMAL LOW (ref 150–400)
RBC: 2.96 MIL/uL — ABNORMAL LOW (ref 4.22–5.81)
RDW: 13.6 % (ref 11.5–15.5)
WBC: 4.6 K/uL (ref 4.0–10.5)
nRBC: 0 % (ref 0.0–0.2)

## 2024-03-28 LAB — BASIC METABOLIC PANEL WITH GFR
Anion gap: 12 (ref 5–15)
BUN: 12 mg/dL (ref 6–20)
CO2: 21 mmol/L — ABNORMAL LOW (ref 22–32)
Calcium: 9.2 mg/dL (ref 8.9–10.3)
Chloride: 104 mmol/L (ref 98–111)
Creatinine, Ser: 1.26 mg/dL — ABNORMAL HIGH (ref 0.61–1.24)
GFR, Estimated: >60 mL/min (ref >60)
Glucose, Bld: 148 mg/dL — ABNORMAL HIGH (ref 70–99)
Potassium: 4.5 mmol/L (ref 3.5–5.1)
Sodium: 137 mmol/L (ref 135–145)

## 2024-03-28 LAB — GLUCOSE, CAPILLARY
Glucose-Capillary: 158 mg/dL — ABNORMAL HIGH (ref 70–99)
Glucose-Capillary: 163 mg/dL — ABNORMAL HIGH (ref 70–99)

## 2024-03-28 SURGERY — CYSTOURETHROSCOPY, WITH URETHRAL STRICTURE DILATION USING DRUG-COATED BALLOON
Anesthesia: General

## 2024-03-28 MED ORDER — PROPOFOL 10 MG/ML IV BOLUS
INTRAVENOUS | Status: DC | PRN
  Administered 2024-03-28 (×2): 50 mg via INTRAVENOUS
  Administered 2024-03-28: 100 ug/kg/min via INTRAVENOUS

## 2024-03-28 MED ORDER — CHLORHEXIDINE GLUCONATE 0.12 % MT SOLN
15.0000 mL | Freq: Once | OROMUCOSAL | Status: AC
  Administered 2024-03-28: 15 mL via OROMUCOSAL

## 2024-03-28 MED ORDER — MIDAZOLAM HCL 2 MG/2ML IJ SOLN
INTRAMUSCULAR | Status: DC | PRN
  Administered 2024-03-28: 2 mg via INTRAVENOUS

## 2024-03-28 MED ORDER — MIDAZOLAM HCL 2 MG/2ML IJ SOLN
INTRAMUSCULAR | Status: AC
Start: 2024-03-28 — End: 2024-03-28
  Filled 2024-03-28: qty 2

## 2024-03-28 MED ORDER — CIPROFLOXACIN IN D5W 400 MG/200ML IV SOLN
INTRAVENOUS | Status: AC
  Filled 2024-03-28: qty 200

## 2024-03-28 MED ORDER — STERILE WATER FOR IRRIGATION IR SOLN
Status: DC | PRN
  Administered 2024-03-28: 500 mL

## 2024-03-28 MED ORDER — FENTANYL CITRATE (PF) 100 MCG/2ML IJ SOLN
25.0000 ug | INTRAMUSCULAR | Status: DC | PRN
  Administered 2024-03-28 (×2): 50 ug via INTRAVENOUS

## 2024-03-28 MED ORDER — CHLORHEXIDINE GLUCONATE 0.12 % MT SOLN
OROMUCOSAL | Status: AC
  Filled 2024-03-28: qty 15

## 2024-03-28 MED ORDER — FENTANYL CITRATE (PF) 100 MCG/2ML IJ SOLN
INTRAMUSCULAR | Status: AC
  Filled 2024-03-28: qty 2

## 2024-03-28 MED ORDER — ORAL CARE MOUTH RINSE: 15.0000 mL | Freq: Once | OROMUCOSAL | Status: AC

## 2024-03-28 MED ORDER — SODIUM CHLORIDE 0.9 % IR SOLN
Status: DC | PRN
  Administered 2024-03-28: 3000 mL

## 2024-03-28 MED ORDER — CEFAZOLIN SODIUM-DEXTROSE 2-4 GM/100ML-% IV SOLN
INTRAVENOUS | Status: AC
  Filled 2024-03-28: qty 100

## 2024-03-28 MED ORDER — CIPROFLOXACIN IN D5W 400 MG/200ML IV SOLN
400.0000 mg | INTRAVENOUS | Status: AC
  Administered 2024-03-28: 400 mg via INTRAVENOUS

## 2024-03-28 MED ORDER — KETAMINE HCL 50 MG/5ML IJ SOSY
PREFILLED_SYRINGE | INTRAMUSCULAR | Status: DC | PRN
  Administered 2024-03-28: 20 mg via INTRAVENOUS

## 2024-03-28 MED ORDER — SODIUM CHLORIDE 0.9 % IV SOLN: INTRAVENOUS | Status: DC

## 2024-03-28 MED ORDER — KETAMINE HCL 50 MG/5ML IJ SOSY
PREFILLED_SYRINGE | INTRAMUSCULAR | Status: AC
  Filled 2024-03-28: qty 5

## 2024-03-28 MED ORDER — IOHEXOL 180 MG/ML  SOLN
INTRAMUSCULAR | Status: DC | PRN
  Administered 2024-03-28: 20 mL

## 2024-03-28 SURGICAL SUPPLY — 22 items
BAG URINE DRAIN 2000ML AR STRL (UROLOGICAL SUPPLIES) ×1 IMPLANT
BALLOON OPTILUME DCB 24X5X75 (BALLOONS) IMPLANT
BALLOON OPTILUME DCB 30X3X75 (BALLOONS) IMPLANT
BALLOON OPTILUME DCB 30X5X75 (BALLOONS) IMPLANT
BRUSH SCRUB EZ 4% CHG (MISCELLANEOUS) ×1 IMPLANT
CATH FOL 2WAY LX 16X5 (CATHETERS) ×1 IMPLANT
CATH FOLEY 2W COUNCIL 20FR 5CC (CATHETERS) IMPLANT
CATH FOLEY 2W COUNCIL 5CC 18FR (CATHETERS) ×1 IMPLANT
CATH SET URETHRAL DILATOR (CATHETERS) ×1 IMPLANT
DEVICE INFLATION ATRION QL4015 (MISCELLANEOUS) IMPLANT
ELECTRODE REM PT RTRN 9FT ADLT (ELECTROSURGICAL) ×1 IMPLANT
GLOVE BIOGEL PI IND STRL 7.5 (GLOVE) ×1 IMPLANT
GOWN STRL REUS W/ TWL XL LVL3 (GOWN DISPOSABLE) ×2 IMPLANT
GUIDEWIRE STR DUAL SENSOR (WIRE) ×1 IMPLANT
HOLDER FOLEY CATH W/STRAP (MISCELLANEOUS) ×1 IMPLANT
PACK CYSTO AR (MISCELLANEOUS) ×1 IMPLANT
SET CYSTO W/LG BORE CLAMP LF (SET/KITS/TRAYS/PACK) ×1 IMPLANT
SOL .9 NS 3000ML IRR UROMATIC (IV SOLUTION) IMPLANT
SYR 30ML LL (SYRINGE) ×1 IMPLANT
SYRINGE TOOMEY IRRIG 70ML (MISCELLANEOUS) ×1 IMPLANT
WATER STERILE IRR 3000ML UROMA (IV SOLUTION) ×1 IMPLANT
WATER STERILE IRR 500ML POUR (IV SOLUTION) ×1 IMPLANT

## 2024-03-28 NOTE — Discharge Instructions (Signed)
 Dilation urethral stricture discharge instructions  Your catheter will need to remain in place for 5 days.   Call our office [phone] for a catheter that is not draining urine, fever greater than 101 degrees If you are sexually active you will need to use a condom with intercourse for 4 weeks After catheter removal urinary frequency, urgency and burning as well as small amounts of blood in the urine will be normal You will be contacted for an appointment for catheter removal and 1 month postop follow-up appointment.

## 2024-03-28 NOTE — Transfer of Care (Signed)
 Immediate Anesthesia Transfer of Care Note  Patient: Dustin Moore  Procedure(s) Performed: CYSTOURETHROSCOPY, WITH URETHRAL STRICTURE DILATION USING DRUG-COATED BALLOON CYSTOSCOPY WITH RETROGRADE URETHROGRAM  Patient Location: PACU  Anesthesia Type:General  Level of Consciousness: drowsy and patient cooperative  Airway & Oxygen Therapy: Patient Spontanous Breathing and Patient connected to face mask oxygen  Post-op Assessment: Report given to RN and Post -op Vital signs reviewed and stable  Post vital signs: Reviewed and stable  Last Vitals:  Vitals Value Taken Time  BP 123/75 03/28/24 17:37  Temp 35.9 C 03/28/24 17:37  Pulse 49 03/28/24 17:38  Resp 13 03/28/24 17:38  SpO2 100 % 03/28/24 17:38  Vitals shown include unfiled device data.  Last Pain:  Vitals:   03/28/24 1457  PainSc: 0-No pain         Complications: No notable events documented.

## 2024-03-28 NOTE — Anesthesia Postprocedure Evaluation (Signed)
 Anesthesia Post Note  Patient: Dustin Moore  Procedure(s) Performed: CYSTOURETHROSCOPY, WITH URETHRAL STRICTURE DILATION USING DRUG-COATED BALLOON CYSTOSCOPY WITH RETROGRADE URETHROGRAM  Patient location during evaluation: PACU Anesthesia Type: General Level of consciousness: awake and alert, oriented and patient cooperative Pain management: pain level controlled Vital Signs Assessment: post-procedure vital signs reviewed and stable Respiratory status: spontaneous breathing, nonlabored ventilation and respiratory function stable Cardiovascular status: blood pressure returned to baseline and stable Postop Assessment: adequate PO intake Anesthetic complications: no   No notable events documented.   Last Vitals:  Vitals:   03/28/24 1815 03/28/24 1820  BP: 138/79   Pulse: 60 (!) 59  Resp: 18 16  Temp: (!) 36.1 C   SpO2: 100% 100%    Last Pain:  Vitals:   03/28/24 1821  PainSc: 4                  Hara Milholland

## 2024-03-28 NOTE — Interval H&P Note (Signed)
 History and Physical Interval Note:  03/28/2024 4:14 PM  Dustin Moore  has presented today for surgery, with the diagnosis of Urethral Stricture.  The various methods of treatment have been discussed with the patient and family. After consideration of risks, benefits and other options for treatment, the patient has consented to  Procedure(s): CYSTOURETHROSCOPY, WITH URETHRAL STRICTURE DILATION USING DRUG-COATED BALLOON (N/A) CYSTOSCOPY WITH RETROGRADE URETHROGRAM (N/A) as a surgical intervention.  The patient's history has been reviewed, patient examined, no change in status, stable for surgery.  I have reviewed the patient's chart and labs.  Questions were answered to the patient's satisfaction.    CV:RRR Lungs:clear  Glendia JAYSON Barba

## 2024-03-28 NOTE — Anesthesia Preprocedure Evaluation (Signed)
 Anesthesia Evaluation  Patient identified by MRN, date of birth, ID band Patient awake    Reviewed: Allergy & Precautions, H&P , NPO status , Patient's Chart, lab work & pertinent test results, reviewed documented beta blocker date and time   History of Anesthesia Complications Negative for: history of anesthetic complications  Airway Mallampati: III  TM Distance: >3 FB Neck ROM: full    Dental  (+) Edentulous Upper, Upper Dentures, Dental Advidsory Given   Pulmonary neg shortness of breath, asthma , COPD, neg recent URI, Current Smoker and Patient abstained from smoking.   Pulmonary exam normal breath sounds clear to auscultation       Cardiovascular Exercise Tolerance: Good hypertension, (-) angina (-) Past MI and (-) Cardiac Stents negative cardio ROS Normal cardiovascular exam(-) dysrhythmias (-) Valvular Problems/Murmurs Rhythm:regular Rate:Normal     Neuro/Psych negative neurological ROS  negative psych ROS   GI/Hepatic negative GI ROS, Neg liver ROS,,,  Endo/Other  diabetes, Insulin  Dependent    Renal/GU negative Renal ROS  negative genitourinary   Musculoskeletal   Abdominal   Peds  Hematology negative hematology ROS (+)   Anesthesia Other Findings Past Medical History: No date: Asthma No date: Diabetes mellitus without complication (HCC)     Comment:  type 2 No date: Hypertension No date: Rotator cuff disorder, left   Reproductive/Obstetrics negative OB ROS                              Anesthesia Physical Anesthesia Plan  ASA: 3  Anesthesia Plan: General   Post-op Pain Management:    Induction: Intravenous  PONV Risk Score and Plan: 1 and TIVA, Propofol  infusion and Treatment may vary due to age or medical condition  Airway Management Planned: Natural Airway and Nasal Cannula  Additional Equipment:   Intra-op Plan:   Post-operative Plan:   Informed Consent: I  have reviewed the patients History and Physical, chart, labs and discussed the procedure including the risks, benefits and alternatives for the proposed anesthesia with the patient or authorized representative who has indicated his/her understanding and acceptance.     Dental Advisory Given  Plan Discussed with: Anesthesiologist, CRNA and Surgeon  Anesthesia Plan Comments:          Anesthesia Quick Evaluation

## 2024-03-28 NOTE — Op Note (Signed)
   Preoperative diagnosis:  Urethral stricture  Postoperative diagnosis:  Urethra stricture  Procedure: Balloon dilation urethral stricture (Optilume DBC)  Surgeon: Glendia JAYSON Barba, MD  Anesthesia: MAC  Complications: None  Intraoperative findings:  ~34F bulbar urethral stricture measuring 1.5 cm Moderate lateral lobe enlargement with mild bladder neck elevation Hyperemic bladder mucosa without mucosal erythema, solid or papillary lesions   EBL: Minimal  Specimens: None  Indication: Dustin Moore is a 63 y.o. patient with a history of urethroplasty and prior urethral dilation with a suspected recurrent stricture.  Refer to the admission H&P for details.  After reviewing the management options for treatment, he elected to proceed with the above surgical procedure(s). We have discussed the potential benefits and risks of the procedure, side effects of the proposed treatment, the likelihood of the patient achieving the goals of the procedure, and any potential problems that might occur during the procedure or recuperation. Informed consent has been obtained.  Description of procedure:  The patient was taken to the operating room and deep sedation was obtained by anesthesia.  The patient was placed in the dorsal lithotomy position, prepped and draped in the usual sterile fashion, and preoperative antibiotics were administered. A preoperative time-out was performed.   A 17.5 French cystoscope with 30 degree lens was lubricated, inserted per urethra and advanced proximally under direct vision until the stricture was identified.  A 0.038 Sensor wire was advanced through the cystoscope and through the stricture into the bladder.  The cystoscope was removed and the stricture was dilated with over-the-wire disposable dilators from 34F-91F.  The cystoscope was then repassed alongside the guidewire under direct vision into the bladder with findings as described above.  ~1800 mL of urine was  drained from the bladder upon introduction of the cystoscope.  The guidewire was removed and repassed through the cystoscope into the bladder.  The scope was backed out just distal to the stricture.  A 64F/30 mm Optilume catheter was then advanced over the wire and positioned through the stricture.  The balloon was inflated to 10 atm.  Fluoroscopy was performed and no waist was noted and the balloon.  The balloon was kept inflated for 5 minutes.  No irrigant was used through the cystoscope during this portion of the procedure and for the remainder of the procedure.  The balloon was then deflated and the dilating catheter was removed.  A 91F Council catheter was then advanced over the wire without difficulty.  The balloon was inflated with 10 mL of sterile water  and placed to gravity drainage.  The patient was then transported to the PACU in stable condition.  Plan: Recommend 5-day catheter dwell time Postop follow-up 1 month    Glendia JAYSON Barba, M.D.

## 2024-03-29 ENCOUNTER — Encounter: Payer: Self-pay | Admitting: Urology

## 2024-04-01 ENCOUNTER — Ambulatory Visit

## 2024-04-01 LAB — CULTURE, URINE COMPREHENSIVE

## 2024-04-03 ENCOUNTER — Ambulatory Visit

## 2024-04-04 ENCOUNTER — Ambulatory Visit (INDEPENDENT_AMBULATORY_CARE_PROVIDER_SITE_OTHER)

## 2024-04-04 DIAGNOSIS — N35919 Unspecified urethral stricture, male, unspecified site: Secondary | ICD-10-CM

## 2024-04-04 NOTE — Progress Notes (Addendum)
 Fill and Pull Catheter Removal  Patient is present today for a catheter removal due to stricture of male urethra.  500 ml of sterile water  was instilled into the bladder when the patient felt the urge to urinate. 10 ml of water  was then drained from the balloon.  A 20 FR foley cath was removed from the bladder no complications were noted .  Foley catheter intact and time of removal. Patient as then given some time to void on their own.  Patient can void  310 ml on their own after some time.  Patient tolerated well.  One oral prophylactic antibiotic given per MD orders  Performed by: Dustin Moore, CMA  Follow up/ Additional notes: Keep follow with Dr. Francisca   Bladder Scan completed today.  Patient can void prior to the bladder scan. Bladder scan result: 180   Performed By: Dustin Moore, CMA  Additional notes-

## 2024-04-04 NOTE — Addendum Note (Signed)
 Addended by: GRETTA MASTERS R on: 04/04/2024 01:56 PM   Modules accepted: Orders

## 2024-04-15 ENCOUNTER — Other Ambulatory Visit: Payer: Self-pay | Admitting: *Deleted

## 2024-04-15 ENCOUNTER — Other Ambulatory Visit: Payer: Self-pay | Admitting: Internal Medicine

## 2024-04-15 ENCOUNTER — Other Ambulatory Visit (HOSPITAL_COMMUNITY): Payer: Self-pay

## 2024-04-15 ENCOUNTER — Telehealth: Payer: Self-pay | Admitting: *Deleted

## 2024-04-15 ENCOUNTER — Telehealth: Payer: Self-pay

## 2024-04-15 DIAGNOSIS — E1165 Type 2 diabetes mellitus with hyperglycemia: Secondary | ICD-10-CM

## 2024-04-15 DIAGNOSIS — Z7984 Long term (current) use of oral hypoglycemic drugs: Secondary | ICD-10-CM

## 2024-04-15 DIAGNOSIS — Z7985 Long-term (current) use of injectable non-insulin antidiabetic drugs: Secondary | ICD-10-CM

## 2024-04-15 DIAGNOSIS — Z794 Long term (current) use of insulin: Secondary | ICD-10-CM

## 2024-04-15 MED ORDER — DEXCOM G7 SENSOR MISC: 1.0000 | 3 refills | Status: DC

## 2024-04-15 NOTE — Telephone Encounter (Signed)
 Patient left a message with the after hours line over the weekend. He is on his last refill and it is not reading on the sensor. He states that he needs a refill but the pharmacy states that he needs a prior authorization.  We will send this to the PA team to see if they could review and if needed start the PA.

## 2024-04-15 NOTE — Telephone Encounter (Signed)
 Pharmacy Patient Advocate Encounter   Received notification from Pt Calls Messages that prior authorization for Dexcom G7 sensor is required/requested.   Insurance verification completed.   The patient is insured through [employer] .   Per test claim: PA required; PA submitted to above mentioned insurance via Latent Key/confirmation #/[facility] Status is pending

## 2024-04-17 NOTE — Telephone Encounter (Signed)
 Pt made aware.

## 2024-04-17 NOTE — Telephone Encounter (Signed)
 Pharmacy Patient Advocate Encounter  Received notification from [employer] that Prior Authorization for Dexcom G7 sensor has been APPROVED from 04/15/24 to 04/15/25

## 2024-04-30 ENCOUNTER — Ambulatory Visit: Admitting: Urology

## 2024-05-07 ENCOUNTER — Other Ambulatory Visit: Payer: Self-pay | Admitting: Internal Medicine

## 2024-05-07 DIAGNOSIS — R110 Nausea: Secondary | ICD-10-CM

## 2024-05-07 DIAGNOSIS — E1140 Type 2 diabetes mellitus with diabetic neuropathy, unspecified: Secondary | ICD-10-CM

## 2024-05-08 ENCOUNTER — Ambulatory Visit: Admitting: Urology

## 2024-05-08 ENCOUNTER — Encounter: Payer: Self-pay | Admitting: Urology

## 2024-05-08 VITALS — BP 106/56 | HR 72 | Ht 69.0 in | Wt 225.0 lb

## 2024-05-08 DIAGNOSIS — N35812 Other urethral bulbous stricture, male: Secondary | ICD-10-CM

## 2024-05-08 DIAGNOSIS — N401 Enlarged prostate with lower urinary tract symptoms: Secondary | ICD-10-CM

## 2024-05-08 DIAGNOSIS — R3911 Hesitancy of micturition: Secondary | ICD-10-CM | POA: Diagnosis not present

## 2024-05-08 LAB — BLADDER SCAN AMB NON-IMAGING: Scan Result: 688

## 2024-05-08 MED ORDER — SILODOSIN 8 MG PO CAPS: 8.0000 mg | ORAL_CAPSULE | Freq: Every day | ORAL | 1 refills | Status: DC

## 2024-05-08 NOTE — Progress Notes (Signed)
 05/08/2024 4:54 PM   Treylon Lanell [birth year] [id number]  Referring provider: Tobie Suzzane POUR, MD [address] [place],  [place] [address]  Chief Complaint  Patient presents with   Other    HPI: Velton Clubb is a 63 y.o. male presents for a postop follow-up.  Refer to Dr. Marval prior office note 03/27/2024 Cystoscopy under anesthesia by me 03/28/2024 remarkable for an 31F bulbar urethral stricture measuring ~5 mm.  He also had moderate lateral lobe enlargement with mild bladder neck elevation.  No bladder mucosal lesions were identified.  Stricture was initially dilated with over-the-wire disposable dilators and when the cystoscope was advanced into the bladder 1800 mL of urine was drained.  Optilume balloon dilation was then performed and his catheter remained indwelling x 5 days He is very satisfied with the procedure and states that his stream has not been this strong in ~20 years he was on tamsulosin  and alfuzosin  which also improved his voiding pattern however could not tolerate secondary to orthostatic symptoms Presently he has no complaints   PMH: Past Medical History:  Diagnosis Date   Asthma    Diabetes mellitus without complication (HCC)    type 2   Hypertension    Rotator cuff disorder, left     Surgical History: Past Surgical History:  Procedure Laterality Date   CYSTOSCOPY WITH RETROGRADE URETHROGRAM N/A 03/28/2024   Procedure: CYSTOSCOPY WITH RETROGRADE URETHROGRAM;  Surgeon: Twylla Glendia BROCKS, MD;  Location: [facility];  Service: Urology;  Laterality: N/A;   ROTATOR CUFF REPAIR Left 08/2021   TONSILLECTOMY AND ADENOIDECTOMY     TOOTH EXTRACTION N/A 12/15/2023   Procedure: Dental extractions of two, six, seven, eight, nine, eleven, thirteen, fifteen, twenty, twenty-one, twenty-two, twenty-three, twenty-four, twenty-five, twenty-six, twenty-seven, twenty-eight, twenty-nine, aveoloplasty, removal of bilateral mandibular lingual tori;  Surgeon: Sheryle Glendia, DMD;   Location: [facility];  Service: Oral Surgery;  Laterality: N/A;   TOTAL HIP ARTHROPLASTY Left 2013    Home Medications:  Allergies as of 05/08/2024       Reactions   Contrast Media [iodinated Contrast Media] Anaphylaxis        Medication List        Accurate as of May 08, 2024  4:54 PM. If you have any questions, ask your nurse or doctor.          STOP taking these medications    alfuzosin  10 MG 24 hr tablet Commonly known as: UROXATRAL  Stopped by: Glendia BROCKS Twylla   ciprofloxacin  500 MG tablet Commonly known as: CIPRO  Stopped by: Bretton Tandy C Mack Alvidrez   naloxone  4 MG/0.1ML Liqd nasal spray kit Commonly known as: NARCAN  Stopped by: Glendia BROCKS Twylla       TAKE these medications    aspirin EC 81 MG tablet Take 81 mg by mouth daily.   B-complex with vitamin C tablet Take 1 tablet by mouth daily.   Basaglar  KwikPen 100 UNIT/ML Inject 16 Units into the skin at bedtime.   Dexcom G7 Sensor Misc Inject 1 Application into the skin as directed. Change sensor every 10 days as directed.   DULoxetine 60 MG capsule Commonly known as: CYMBALTA TAKE 1 CAPSULE BY MOUTH DAILY   furosemide  40 MG tablet Commonly known as: LASIX  Take 1 tablet (40 mg total) by mouth daily. What changed:  when to take this reasons to take this   gabapentin  300 MG capsule Commonly known as: NEURONTIN  TAKE 1 CAPSULE(300 MG) BY MOUTH AT BEDTIME   insulin  lispro 100 UNIT/ML KwikPen  Commonly known as: HumaLOG  KwikPen Inject 5-11 Units into the skin 3 (three) times daily.   ipratropium-albuterol  0.5-2.5 (3) MG/3ML Soln Commonly known as: DUONEB USE 1 VIAL VIA NEBULIZER EVERY 4 HOURS AS NEEDED FOR WHEEZING OR SHORTNESS OF BREATH.   losartan-hydrochlorothiazide 50-12.5 MG tablet Commonly known as: HYZAAR TAKE 1 TABLET BY MOUTH DAILY   MAGNESIUM PO Take 500 mg by mouth daily.   metFORMIN  500 MG tablet Commonly known as: GLUCOPHAGE  TAKE 1 TABLET(500 MG) BY MOUTH TWICE DAILY WITH A MEAL    methadone 10 MG/5ML solution Commonly known as: DOLOPHINE Take 80 mLs by mouth daily.   ondansetron  4 MG tablet Commonly known as: ZOFRAN  TAKE 1 TABLET(4 MG) BY MOUTH EVERY 8 HOURS AS NEEDED FOR NAUSEA OR VOMITING   Potassium 99 MG Tabs Take 99 mg by mouth daily.   rosuvastatin  5 MG tablet Commonly known as: CRESTOR  TAKE 1 TABLET(5 MG) BY MOUTH EVERY EVENING   silodosin  8 MG Caps capsule Commonly known as: RAPAFLO  Take 1 capsule (8 mg total) by mouth daily with breakfast. Started by: Glendia JAYSON Barba   Symbicort  160-4.5 MCG/ACT inhaler Generic drug: budesonide -formoterol  Inhale 2 puffs into the lungs 2 (two) times daily. in the morning and at bedtime.   traZODone  50 MG tablet Commonly known as: DESYREL  Take 0.5-1 tablets (25-50 mg total) by mouth at bedtime as needed for sleep.   TRIPLE OMEGA COMPLEX PO Take 1 capsule by mouth daily.   Ventolin  HFA 108 (90 Base) MCG/ACT inhaler Generic drug: albuterol  Inhale 1-2 puffs into the lungs every 6 (six) hours as needed for wheezing or shortness of breath.        Allergies:  Allergies  Allergen Reactions   Contrast Media [Iodinated Contrast Media] Anaphylaxis    Family History: Family History  Problem Relation Age of Onset   Diabetes Mother    Cirrhosis Mother    Heart disease Father    Heart attack Father    Heart disease Paternal Uncle     Social History:  reports that he has been smoking cigarettes. He has a 19.5 pack-year smoking history. He has never used smokeless tobacco. He reports that he does not currently use alcohol. He reports that he does not currently use drugs after having used the following drugs: Marijuana and Cocaine.   Physical Exam: BP (!) 106/56   Pulse 72   Ht 5' 9 (1.753 m)   Wt 225 lb (102.1 kg)   BMI 33.23 kg/m   Constitutional:  Alert, No acute distress. HEENT: [place] AT Respiratory: Normal respiratory effort, no increased work of breathing. Psychiatric: Normal mood and  affect.   Assessment & Plan:    1.  Bulbar urethral stricture Doing well status post Optilume balloon dilation PVR today was 688 mL however he most likely has had a chronically over distended bladder with almost 2L of urine in his bladder at the time of cystoscopy.  RUS at that time showed no hydronephrosis and this is most likely his baseline 58-month follow-up with IPSS/PVR.  He was instructed to call earlier should he note onset of obstructive voiding symptoms  2. Benign prostatic hyperplasia with LUTS Unable to tolerate alfuzosin /tamsulosin  secondary to orthostatic symptoms Trial silodosin  8 mg daily    Glendia JAYSON Barba, MD  [facility] [address] [place], [place] [address] [phone]

## 2024-05-09 MED ORDER — SILODOSIN 8 MG PO CAPS: 8.0000 mg | ORAL_CAPSULE | Freq: Every day | ORAL | 1 refills | Status: DC

## 2024-05-09 NOTE — Addendum Note (Signed)
 Addended by: GENITA HARLENE CROME on: 05/09/2024 08:51 AM   Modules accepted: Orders

## 2024-05-11 ENCOUNTER — Other Ambulatory Visit: Payer: Self-pay | Admitting: Internal Medicine

## 2024-05-13 ENCOUNTER — Other Ambulatory Visit: Admitting: Urology

## 2024-05-27 ENCOUNTER — Ambulatory Visit: Admitting: Nurse Practitioner

## 2024-05-27 DIAGNOSIS — Z7985 Long-term (current) use of injectable non-insulin antidiabetic drugs: Secondary | ICD-10-CM

## 2024-05-27 DIAGNOSIS — E559 Vitamin D deficiency, unspecified: Secondary | ICD-10-CM

## 2024-05-27 DIAGNOSIS — Z794 Long term (current) use of insulin: Secondary | ICD-10-CM

## 2024-05-27 DIAGNOSIS — E782 Mixed hyperlipidemia: Secondary | ICD-10-CM

## 2024-05-27 DIAGNOSIS — I10 Essential (primary) hypertension: Secondary | ICD-10-CM

## 2024-05-27 DIAGNOSIS — E1165 Type 2 diabetes mellitus with hyperglycemia: Secondary | ICD-10-CM

## 2024-05-27 DIAGNOSIS — Z7984 Long term (current) use of oral hypoglycemic drugs: Secondary | ICD-10-CM

## 2024-05-28 ENCOUNTER — Other Ambulatory Visit: Payer: Self-pay | Admitting: Internal Medicine

## 2024-05-28 DIAGNOSIS — N200 Calculus of kidney: Secondary | ICD-10-CM

## 2024-05-28 DIAGNOSIS — N401 Enlarged prostate with lower urinary tract symptoms: Secondary | ICD-10-CM

## 2024-06-03 ENCOUNTER — Ambulatory Visit: Payer: Self-pay

## 2024-06-03 ENCOUNTER — Other Ambulatory Visit: Payer: Self-pay | Admitting: Internal Medicine

## 2024-06-03 ENCOUNTER — Ambulatory Visit: Admitting: Internal Medicine

## 2024-06-03 VITALS — BP 108/57 | HR 78 | Ht 69.0 in | Wt 237.0 lb

## 2024-06-03 DIAGNOSIS — Z23 Encounter for immunization: Secondary | ICD-10-CM

## 2024-06-03 DIAGNOSIS — M7989 Other specified soft tissue disorders: Secondary | ICD-10-CM

## 2024-06-03 DIAGNOSIS — N401 Enlarged prostate with lower urinary tract symptoms: Secondary | ICD-10-CM | POA: Diagnosis not present

## 2024-06-03 DIAGNOSIS — Z87448 Personal history of other diseases of urinary system: Secondary | ICD-10-CM

## 2024-06-03 DIAGNOSIS — R3911 Hesitancy of micturition: Secondary | ICD-10-CM

## 2024-06-03 DIAGNOSIS — E1140 Type 2 diabetes mellitus with diabetic neuropathy, unspecified: Secondary | ICD-10-CM | POA: Diagnosis not present

## 2024-06-03 DIAGNOSIS — Z794 Long term (current) use of insulin: Secondary | ICD-10-CM

## 2024-06-03 MED ORDER — TAMSULOSIN HCL 0.4 MG PO CAPS: 0.8000 mg | ORAL_CAPSULE | Freq: Every day | ORAL | 5 refills | Status: DC

## 2024-06-03 MED ORDER — GABAPENTIN 300 MG PO CAPS: 300.0000 mg | ORAL_CAPSULE | Freq: Three times a day (TID) | ORAL | 3 refills | Status: DC

## 2024-06-03 NOTE — Assessment & Plan Note (Signed)
 Worse currently, it is likely due to chronic venous insufficiency Advised to perform leg elevation and use compression socks as tolerated Advised to cut down salt intake Lasix  as needed for persistent swelling

## 2024-06-03 NOTE — Progress Notes (Signed)
 Acute Office Visit  Subjective:    Patient ID: Dustin Moore, male    DOB: 03/02/1961, 63 y.o.   MRN: [id number]  Chief Complaint  Patient presents with   Foot Pain    Reports diabetic foot pain, ongoing for 4 days , had swelling went away.     HPI Patient is in today for complaint of bilateral foot pain for the last 4 days.  His pain is constant, sharp and feels like pins-and-needles sensation.  He has a history of diabetic neuropathy.  He currently takes Cymbalta 60 mg QD and gabapentin  300 mg nightly.  Of note, he has run out of gabapentin  for the last 2 days.  He also reports bilateral leg swelling, which is chronic.  He takes losartan-HCTZ for HTN.  He also has Lasix  40 mg as needed for leg swelling.  Denies any recent worsening of dyspnea, orthopnea or PND.  He has been evaluated by urology for urinary hesitancy.  He was placed on Rapaflo , but could not get it due to insurance coverage concern.  He has started taking tamsulosin  again with improvement in his urinary hesitancy/resistance.  Denies any dysuria, hematuria.  Past Medical History:  Diagnosis Date   Asthma    Diabetes mellitus without complication (HCC)    type 2   Hypertension    Rotator cuff disorder, left     Past Surgical History:  Procedure Laterality Date   CYSTOSCOPY WITH RETROGRADE URETHROGRAM N/A 03/28/2024   Procedure: CYSTOSCOPY WITH RETROGRADE URETHROGRAM;  Surgeon: Twylla Glendia BROCKS, MD;  Location: [facility];  Service: Urology;  Laterality: N/A;   ROTATOR CUFF REPAIR Left 08/2021   TONSILLECTOMY AND ADENOIDECTOMY     TOOTH EXTRACTION N/A 12/15/2023   Procedure: Dental extractions of two, six, seven, eight, nine, eleven, thirteen, fifteen, twenty, twenty-one, twenty-two, twenty-three, twenty-four, twenty-five, twenty-six, twenty-seven, twenty-eight, twenty-nine, aveoloplasty, removal of bilateral mandibular lingual tori;  Surgeon: Sheryle Glendia, DMD;  Location: [facility];  Service: Oral Surgery;  Laterality: N/A;    TOTAL HIP ARTHROPLASTY Left 2013    Family History  Problem Relation Age of Onset   Diabetes Mother    Cirrhosis Mother    Heart disease Father    Heart attack Father    Heart disease Paternal Uncle     Social History   Socioeconomic History   Marital status: Single    Spouse name: Not on file   Number of children: Not on file   Years of education: Not on file   Highest education level: Some college, no degree  Occupational History   Not on file  Tobacco Use   Smoking status: Every Day    Current packs/day: 0.50    Average packs/day: 0.5 packs/day for 39.0 years (19.5 ttl pk-yrs)    Types: Cigarettes   Smokeless tobacco: Never   Tobacco comments:    5 cigarettes per day as of 06/03/24  Vaping Use   Vaping status: Never Used  Substance and Sexual Activity   Alcohol use: Not Currently    Comment: occasinally on weekends    Drug use: Not Currently    Types: Marijuana, Cocaine    Comment: last percocet use 01/2022 (no MJ or cocaine since he was in his 30s)   Sexual activity: Yes  Other Topics Concern   Not on file  Social History Narrative   Not on file   Social Drivers of Health   Financial Resource Strain: Low Risk  (06/03/2024)   Overall Financial Resource Strain (CARDIA)  Difficulty of Paying Living Expenses: Not hard at all  Food Insecurity: No Food Insecurity (06/03/2024)   Hunger Vital Sign    Worried About Running Out of Food in the Last Year: Never true    Ran Out of Food in the Last Year: Never true  Transportation Needs: No Transportation Needs (06/03/2024)   PRAPARE - Administrator, Civil Service (Medical): No    Lack of Transportation (Non-Medical): No  Physical Activity: Unknown (06/03/2024)   Exercise Vital Sign    Days of Exercise per Week: Patient declined    Minutes of Exercise per Session: Not on file  Stress: No Stress Concern Present (06/03/2024)   [employer] of Occupational Health - Occupational Stress  Questionnaire    Feeling of Stress: Not at all  Social Connections: Moderately Integrated (06/03/2024)   Social Connection and Isolation Panel    Frequency of Communication with Friends and Family: More than three times a week    Frequency of Social Gatherings with Friends and Family: Twice a week    Attends Religious Services: 1 to 4 times per year    Active Member of [employer] or Organizations: No    Attends Engineer, Structural: Not on file    Marital Status: Living with partner  Intimate Partner Violence: Not on file    Outpatient Medications Prior to Visit  Medication Sig Dispense Refill   aspirin EC 81 MG tablet Take 81 mg by mouth daily.     B Complex-C (B-COMPLEX WITH VITAMIN C) tablet Take 1 tablet by mouth daily.     Continuous Glucose Sensor (DEXCOM G7 SENSOR) MISC Inject 1 Application into the skin as directed. Change sensor every 10 days as directed. 9 each 3   DULoxetine (CYMBALTA) 60 MG capsule TAKE 1 CAPSULE BY MOUTH DAILY 30 capsule 0   furosemide  (LASIX ) 40 MG tablet Take 1 tablet (40 mg total) by mouth daily. (Patient taking differently: Take 40 mg by mouth daily as needed for fluid or edema.) 30 tablet 3   Insulin  Glargine (BASAGLAR  KWIKPEN) 100 UNIT/ML Inject 16 Units into the skin at bedtime. 15 mL 3   insulin  lispro (HUMALOG  KWIKPEN) 100 UNIT/ML KwikPen Inject 5-11 Units into the skin 3 (three) times daily. 30 mL 3   ipratropium-albuterol  (DUONEB) 0.5-2.5 (3) MG/3ML SOLN USE 1 VIAL VIA NEBULIZER EVERY 4 HOURS AS NEEDED FOR WHEEZING OR SHORTNESS OF BREATH. 1620 mL 2   losartan-hydrochlorothiazide (HYZAAR) 50-12.5 MG tablet TAKE 1 TABLET BY MOUTH DAILY 90 tablet 0   MAGNESIUM PO Take 500 mg by mouth daily.     metFORMIN  (GLUCOPHAGE ) 500 MG tablet TAKE 1 TABLET(500 MG) BY MOUTH TWICE DAILY WITH A MEAL 180 tablet 1   methadone (DOLOPHINE) 10 MG/5ML solution Take 80 mLs by mouth daily.     Omega 3-6-9 Fatty Acids (TRIPLE OMEGA COMPLEX PO) Take 1 capsule by mouth  daily.     ondansetron  (ZOFRAN ) 4 MG tablet TAKE 1 TABLET(4 MG) BY MOUTH EVERY 8 HOURS AS NEEDED FOR NAUSEA OR VOMITING 20 tablet 0   Potassium 99 MG TABS Take 99 mg by mouth daily.     rosuvastatin  (CRESTOR ) 5 MG tablet TAKE 1 TABLET(5 MG) BY MOUTH EVERY EVENING 90 tablet 1   SYMBICORT  160-4.5 MCG/ACT inhaler Inhale 2 puffs into the lungs 2 (two) times daily. in the morning and at bedtime. 33 g 3   traZODone  (DESYREL ) 50 MG tablet Take 0.5-1 tablets (25-50 mg total) by mouth at bedtime as  needed for sleep. 30 tablet 3   VENTOLIN  HFA 108 (90 Base) MCG/ACT inhaler Inhale 1-2 puffs into the lungs every 6 (six) hours as needed for wheezing or shortness of breath. 54 g 3   gabapentin  (NEURONTIN ) 300 MG capsule TAKE 1 CAPSULE(300 MG) BY MOUTH AT BEDTIME 30 capsule 3   silodosin  (RAPAFLO ) 8 MG CAPS capsule Take 1 capsule (8 mg total) by mouth daily with breakfast. 30 capsule 1   No facility-administered medications prior to visit.    Allergies  Allergen Reactions   Contrast Media [Iodinated Contrast Media] Anaphylaxis    Review of Systems  Constitutional:  Negative for chills and fever.  HENT:  Negative for congestion and sore throat.   Eyes:  Negative for pain and discharge.  Respiratory:  Negative for cough and shortness of breath.   Cardiovascular:  Positive for leg swelling. Negative for chest pain and palpitations.  Gastrointestinal:  Negative for diarrhea, nausea and vomiting.  Endocrine: Negative for polydipsia and polyuria.  Genitourinary:  Positive for difficulty urinating and frequency. Negative for dysuria and hematuria.  Musculoskeletal:  Negative for neck pain and neck stiffness.  Skin:  Negative for rash.  Neurological:  Positive for numbness. Negative for dizziness, weakness and headaches.  Psychiatric/Behavioral:  Negative for agitation and behavioral problems.   Bilateral foot pain     Objective:    Physical Exam Vitals reviewed.  Constitutional:      General: He is  not in acute distress.    Appearance: He is not diaphoretic.  HENT:     Head: Normocephalic and atraumatic.     Nose: Nose normal.     Mouth/Throat:     Mouth: Mucous membranes are moist.  Eyes:     General: No scleral icterus.    Extraocular Movements: Extraocular movements intact.  Cardiovascular:     Rate and Rhythm: Normal rate and regular rhythm.     Heart sounds: Normal heart sounds. No murmur heard. Pulmonary:     Breath sounds: Normal breath sounds. No wheezing or rales.  Musculoskeletal:     Cervical back: Neck supple. No tenderness.     Right lower leg: Edema (1+) present.     Left lower leg: Edema (1+) present.  Skin:    General: Skin is warm.     Findings: No rash.  Neurological:     General: No focal deficit present.     Mental Status: He is alert and oriented to person, place, and time.     Cranial Nerves: No cranial nerve deficit.     Sensory: Sensory deficit (B/l feet) present.     Motor: No weakness.  Psychiatric:        Mood and Affect: Mood normal.        Behavior: Behavior normal.     BP (!) 108/57   Pulse 78   Ht 5' 9 (1.753 m)   Wt 237 lb (107.5 kg)   SpO2 98%   BMI 35.00 kg/m  Wt Readings from Last 3 Encounters:  06/03/24 237 lb (107.5 kg)  05/08/24 225 lb (102.1 kg)  03/27/24 234 lb 3.2 oz (106.2 kg)        Assessment & Plan:   Problem List Items Addressed This Visit       Endocrine   Type 2 diabetes mellitus with diabetic neuropathy, with long-term current use of insulin  (HCC) - Primary   Uncontrolled Continue Cymbalta 60 mg QD for now Increased gabapentin  to 300 mg QAM and 300-600 mg  qHS      Relevant Medications   gabapentin  (NEURONTIN ) 300 MG capsule   Other Relevant Orders   Microalbumin / creatinine urine ratio     Genitourinary   Benign prostatic hyperplasia with urinary hesitancy   Chronic urinary hesitancy and retention likely due to BPH Likely has overflow incontinence Continue tamsulosin  0.8 mg QD Followed by  urology - s/p dilation of urethral stricture      Relevant Medications   tamsulosin  (FLOMAX ) 0.4 MG CAPS capsule     Other   Leg swelling   Worse currently, it is likely due to chronic venous insufficiency Advised to perform leg elevation and use compression socks as tolerated Advised to cut down salt intake Lasix  as needed for persistent swelling      History of urethral stricture   Reports remote traumatic injury to scrotum and urethra, which required surgical repair Recently had dilation of urethral stricture in 08/25 He has needed his urinary catheter placement in the past as well Followed by urology      Other Visit Diagnoses       Encounter for immunization       Relevant Orders   Flu vaccine trivalent PF, 6mos and older(Flulaval,Afluria,Fluarix,Fluzone) (Completed)        Meds ordered this encounter  Medications   gabapentin  (NEURONTIN ) 300 MG capsule    Sig: Take 1 capsule (300 mg total) by mouth 3 (three) times daily.    Dispense:  90 capsule    Refill:  3    Dose change - 06/03/24   tamsulosin  (FLOMAX ) 0.4 MG CAPS capsule    Sig: Take 2 capsules (0.8 mg total) by mouth daily.    Dispense:  60 capsule    Refill:  5     Murlin Schrieber MARLA Blanch, MD

## 2024-06-03 NOTE — Assessment & Plan Note (Signed)
 Reports remote traumatic injury to scrotum and urethra, which required surgical repair Recently had dilation of urethral stricture in 08/25 He has needed his urinary catheter placement in the past as well Followed by urology

## 2024-06-03 NOTE — Patient Instructions (Signed)
 Please take Gabapentin  twice daily. Okay to take additional dose of Gabapentin  at bedtime for persistent pain.  Please perform leg elevation for leg swelling.

## 2024-06-03 NOTE — Assessment & Plan Note (Signed)
 Uncontrolled Continue Cymbalta 60 mg QD for now Increased gabapentin  to 300 mg QAM and 300-600 mg qHS

## 2024-06-03 NOTE — Assessment & Plan Note (Signed)
 Chronic urinary hesitancy and retention likely due to BPH Likely has overflow incontinence Continue tamsulosin  0.8 mg QD Followed by urology - s/p dilation of urethral stricture

## 2024-06-03 NOTE — Telephone Encounter (Signed)
 FYI Only or Action Required?: Action required by provider: clinical question for provider and update on patient condition.  Patient was last seen in primary care on 02/28/2024 by Tobie Suzzane POUR, MD.  Called Nurse Triage reporting Foot Pain.  Symptoms began several days ago.  Interventions attempted: Prescription medications: Cymbalta, Neurontin , Lasix .  Symptoms are: unchanged.  Triage Disposition: See PCP When Office is Open (Within 3 Days)  Patient/caregiver understands and will follow disposition?: No, wishes to speak with PCP    Copied from CRM [id number]. Topic: Clinical - Red Word Triage >> Jun 03, 2024 10:51 AM Wess RAMAN wrote: Red Word that prompted transfer to Nurse Triage: Swelling in feet, sharp pains. Neuropathy  DULoxetine (CYMBALTA) 60 MG capsule and gabapentin  (NEURONTIN ) 300 MG capsule are not working      Reason for Disposition  [1] MODERATE pain (e.g., interferes with normal activities, limping) AND [2] present > 3 days  Answer Assessment - Initial Assessment Questions Patient has been taking his Cymbalta, Neurontin , and Lasix  without relief. Patient would like to know if anything else could be prescribed to help his symptoms. He has declined an appointment at this time. Please advise.       1. ONSET: When did the pain start?      4 days ago 2. LOCATION: Where is the pain located?      Bilateral feet 3. PAIN: How bad is the pain?    (Scale 1-10; or mild, moderate, severe)     Moderate  4. WORK OR EXERCISE: Has there been any recent work or exercise that involved this part of the body?      No 5. CAUSE: What do you think is causing the foot pain?     History of neuropathy  6. OTHER SYMPTOMS: Do you have any other symptoms? (e.g., leg pain, rash, fever, numbness)     Some swelling in his feet  Protocols used: Foot Pain-A-AH

## 2024-06-09 ENCOUNTER — Other Ambulatory Visit: Payer: Self-pay | Admitting: Internal Medicine

## 2024-06-18 ENCOUNTER — Other Ambulatory Visit: Payer: Self-pay | Admitting: Nurse Practitioner

## 2024-06-20 ENCOUNTER — Other Ambulatory Visit: Payer: Self-pay | Admitting: Internal Medicine

## 2024-06-20 DIAGNOSIS — R110 Nausea: Secondary | ICD-10-CM

## 2024-06-26 ENCOUNTER — Ambulatory Visit: Admitting: Internal Medicine

## 2024-06-26 ENCOUNTER — Encounter: Payer: Self-pay | Admitting: Internal Medicine

## 2024-06-26 VITALS — BP 110/72 | HR 111 | Ht 69.5 in | Wt 233.0 lb

## 2024-06-26 DIAGNOSIS — K219 Gastro-esophageal reflux disease without esophagitis: Secondary | ICD-10-CM

## 2024-06-26 DIAGNOSIS — E1165 Type 2 diabetes mellitus with hyperglycemia: Secondary | ICD-10-CM

## 2024-06-26 DIAGNOSIS — I10 Essential (primary) hypertension: Secondary | ICD-10-CM | POA: Diagnosis not present

## 2024-06-26 DIAGNOSIS — R3911 Hesitancy of micturition: Secondary | ICD-10-CM

## 2024-06-26 DIAGNOSIS — E1140 Type 2 diabetes mellitus with diabetic neuropathy, unspecified: Secondary | ICD-10-CM | POA: Diagnosis not present

## 2024-06-26 DIAGNOSIS — Z125 Encounter for screening for malignant neoplasm of prostate: Secondary | ICD-10-CM

## 2024-06-26 DIAGNOSIS — R0609 Other forms of dyspnea: Secondary | ICD-10-CM

## 2024-06-26 DIAGNOSIS — J449 Chronic obstructive pulmonary disease, unspecified: Secondary | ICD-10-CM

## 2024-06-26 DIAGNOSIS — E782 Mixed hyperlipidemia: Secondary | ICD-10-CM

## 2024-06-26 DIAGNOSIS — Z794 Long term (current) use of insulin: Secondary | ICD-10-CM

## 2024-06-26 DIAGNOSIS — N401 Enlarged prostate with lower urinary tract symptoms: Secondary | ICD-10-CM

## 2024-06-26 DIAGNOSIS — M7989 Other specified soft tissue disorders: Secondary | ICD-10-CM

## 2024-06-26 MED ORDER — LOSARTAN POTASSIUM 50 MG PO TABS: 50.0000 mg | ORAL_TABLET | Freq: Every day | ORAL | 1 refills | Status: DC

## 2024-06-26 MED ORDER — OMEPRAZOLE 40 MG PO CPDR: 40.0000 mg | DELAYED_RELEASE_CAPSULE | Freq: Every day | ORAL | 5 refills | Status: AC

## 2024-06-26 NOTE — Assessment & Plan Note (Addendum)
 BP Readings from Last 1 Encounters:  06/26/24 110/72   BP sometimes falls to 90s/50s Tightly controlled with with losartan -HCTZ 50-12.5 mg once daily DC HCTZ, switched to losartan  50 mg QD for now Lasix  40 mg QD instead of BID, as needed for persistent swelling only Counseled for compliance with the medications Advised DASH diet and moderate exercise/walking, at least 150 mins/week

## 2024-06-26 NOTE — Assessment & Plan Note (Signed)
 Well-controlled with Symbicort  and PRN Albuterol  Trying to cut down smoking

## 2024-06-26 NOTE — Patient Instructions (Addendum)
 Please start taking Losartan instead of Losartan-HCTZ.  Please take Omeprazole once daily. Okay to take Pepcid in the evening for persistent stomach concerns.  Please continue to take other medications as prescribed.  Please continue to follow low carb diet and perform moderate exercise/walking at least 150 mins/week.  Please get fasting blood tests done within a week.

## 2024-06-26 NOTE — Progress Notes (Signed)
 Established Patient Office Visit  Subjective:  Patient ID: Dustin Moore, male    DOB: [birth year]  Age: 63 y.o. MRN: [id number]  CC:  Chief Complaint  Patient presents with   Hypertension    Six month follow up   Medication Reaction    Dizziness, gas and hoarseness patient feels are reactions to medications    HPI Dustin Moore is a 63 y.o. male with past medical history of HTN, type II DM, HLD, asthma, obesity and tobacco abuse who presents for f/u of his chronic medical conditions.  HTN: BP is low-normal at times.  Reports episodes of dizziness, especially upon standing up.  Takes losartan -HCTZ  50-12.5 mg QD regularly.  He is also taking Lasix  40 mg twice daily for leg swelling.  Patient denies headache, chest pain, or palpitations.  He continues to complain of recurrent bilateral leg swelling, and has exertional dyspnea as well.  Type II DM: He takes Basaglar  25 units nightly, ISS and metformin  500 mg twice daily.  He did not tolerate Trulicity or Ozempic in the past.  Currently followed by endocrinology.  He has CGM, which shows better glycemic profile now.  He had hyperglycemia spells previously as he has to eat soft food due to recent dental workup, has been eating mashed potatoes.  Has been taking Crestor  to reduce CVA and CAD risk.  Asthma: He uses Symbicort  and as needed albuterol  currently.  He requests refill of Symbicort  currently.  He has cut down smoking, currently smokes 3 to 4 cigarettes a day.  Denies any dyspnea or wheezing currently.  GERD: He reports recent worsening of the voice -hoarseness of the voice.  He also reports bloating sensation especially after eating.  Denies any nausea or vomiting currently.  Denies melena or hematochezia currently.  BPH: He has started taking alfuzosin  in the evening again, as he was not getting adequate response with tamsulosin .  Past Medical History:  Diagnosis Date   Asthma    Diabetes mellitus without complication (HCC)    type  2   Hypertension    Rotator cuff disorder, left     Past Surgical History:  Procedure Laterality Date   CYSTOSCOPY WITH RETROGRADE URETHROGRAM N/A 03/28/2024   Procedure: CYSTOSCOPY WITH RETROGRADE URETHROGRAM;  Surgeon: Twylla Glendia BROCKS, MD;  Location: [facility];  Service: Urology;  Laterality: N/A;   ROTATOR CUFF REPAIR Left 08/2021   TONSILLECTOMY AND ADENOIDECTOMY     TOOTH EXTRACTION N/A 12/15/2023   Procedure: Dental extractions of two, six, seven, eight, nine, eleven, thirteen, fifteen, twenty, twenty-one, twenty-two, twenty-three, twenty-four, twenty-five, twenty-six, twenty-seven, twenty-eight, twenty-nine, aveoloplasty, removal of bilateral mandibular lingual tori;  Surgeon: Sheryle Glendia, DMD;  Location: [facility];  Service: Oral Surgery;  Laterality: N/A;   TOTAL HIP ARTHROPLASTY Left 2013    Family History  Problem Relation Age of Onset   Diabetes Mother    Cirrhosis Mother    Heart disease Father    Heart attack Father    Heart disease Paternal Uncle     Social History   Socioeconomic History   Marital status: Single    Spouse name: Not on file   Number of children: Not on file   Years of education: Not on file   Highest education level: Some college, no degree  Occupational History   Not on file  Tobacco Use   Smoking status: Every Day    Current packs/day: 0.50    Average packs/day: 0.5 packs/day for 39.0 years (19.5 ttl pk-yrs)  Types: Cigarettes   Smokeless tobacco: Never   Tobacco comments:    5 cigarettes per day as of 06/03/24  Vaping Use   Vaping status: Never Used  Substance and Sexual Activity   Alcohol use: Not Currently    Comment: occasinally on weekends    Drug use: Not Currently    Types: Marijuana, Cocaine    Comment: last percocet use 01/2022 (no MJ or cocaine since he was in his 30s)   Sexual activity: Yes  Other Topics Concern   Not on file  Social History Narrative   Not on file   Social Drivers of Health   Financial Resource  Strain: Low Risk  (06/03/2024)   Overall Financial Resource Strain (CARDIA)    Difficulty of Paying Living Expenses: Not hard at all  Food Insecurity: No Food Insecurity (06/03/2024)   Hunger Vital Sign    Worried About Running Out of Food in the Last Year: Never true    Ran Out of Food in the Last Year: Never true  Transportation Needs: No Transportation Needs (06/03/2024)   PRAPARE - Administrator, Civil Service (Medical): No    Lack of Transportation (Non-Medical): No  Physical Activity: Unknown (06/03/2024)   Exercise Vital Sign    Days of Exercise per Week: Patient declined    Minutes of Exercise per Session: Not on file  Stress: No Stress Concern Present (06/03/2024)   [employer] of Occupational Health - Occupational Stress Questionnaire    Feeling of Stress: Not at all  Social Connections: Moderately Integrated (06/03/2024)   Social Connection and Isolation Panel    Frequency of Communication with Friends and Family: More than three times a week    Frequency of Social Gatherings with Friends and Family: Twice a week    Attends Religious Services: 1 to 4 times per year    Active Member of [employer] or Organizations: No    Attends Engineer, Structural: Not on file    Marital Status: Living with partner  Intimate Partner Violence: Not on file    Outpatient Medications Prior to Visit  Medication Sig Dispense Refill   alfuzosin  (UROXATRAL ) 10 MG 24 hr tablet Take 10 mg by mouth daily with breakfast.     aspirin EC 81 MG tablet Take 81 mg by mouth daily.     B Complex-C (B-COMPLEX WITH VITAMIN C) tablet Take 1 tablet by mouth daily.     Continuous Glucose Sensor (DEXCOM G7 SENSOR) MISC Inject 1 Application into the skin as directed. Change sensor every 10 days as directed. 9 each 3   DULoxetine (CYMBALTA) 60 MG capsule TAKE 1 CAPSULE BY MOUTH DAILY 30 capsule 0   furosemide  (LASIX ) 40 MG tablet TAKE 1 TABLET(40 MG) BY MOUTH DAILY 30 tablet 3   gabapentin   (NEURONTIN ) 300 MG capsule Take 1 capsule (300 mg total) by mouth 3 (three) times daily. 90 capsule 3   Insulin  Glargine (BASAGLAR  KWIKPEN) 100 UNIT/ML Inject 16 Units into the skin at bedtime. 15 mL 1   insulin  lispro (HUMALOG  KWIKPEN) 100 UNIT/ML KwikPen Inject 5-11 Units into the skin 3 (three) times daily. 30 mL 3   ipratropium-albuterol  (DUONEB) 0.5-2.5 (3) MG/3ML SOLN USE 1 VIAL VIA NEBULIZER EVERY 4 HOURS AS NEEDED FOR WHEEZING OR SHORTNESS OF BREATH. 1620 mL 2   MAGNESIUM PO Take 500 mg by mouth daily.     metFORMIN  (GLUCOPHAGE ) 500 MG tablet TAKE 1 TABLET(500 MG) BY MOUTH TWICE DAILY WITH A MEAL 180  tablet 1   methadone (DOLOPHINE) 10 MG/5ML solution Take 80 mLs by mouth daily.     Omega 3-6-9 Fatty Acids (TRIPLE OMEGA COMPLEX PO) Take 1 capsule by mouth daily.     ondansetron  (ZOFRAN ) 4 MG tablet TAKE 1 TABLET(4 MG) BY MOUTH EVERY 8 HOURS AS NEEDED FOR NAUSEA OR VOMITING 20 tablet 0   Potassium 99 MG TABS Take 99 mg by mouth daily.     rosuvastatin  (CRESTOR ) 5 MG tablet TAKE 1 TABLET(5 MG) BY MOUTH EVERY EVENING 90 tablet 1   SYMBICORT  160-4.5 MCG/ACT inhaler Inhale 2 puffs into the lungs 2 (two) times daily. in the morning and at bedtime. 33 g 3   traZODone  (DESYREL ) 50 MG tablet Take 0.5-1 tablets (25-50 mg total) by mouth at bedtime as needed for sleep. 30 tablet 3   VENTOLIN  HFA 108 (90 Base) MCG/ACT inhaler Inhale 1-2 puffs into the lungs every 6 (six) hours as needed for wheezing or shortness of breath. 54 g 3   losartan -hydrochlorothiazide (HYZAAR) 50-12.5 MG tablet TAKE 1 TABLET BY MOUTH DAILY 90 tablet 0   tamsulosin  (FLOMAX ) 0.4 MG CAPS capsule Take 2 capsules (0.8 mg total) by mouth daily. 60 capsule 5   No facility-administered medications prior to visit.    Allergies  Allergen Reactions   Contrast Media [Iodinated Contrast Media] Anaphylaxis    ROS Review of Systems  Constitutional:  Negative for chills and fever.  HENT:  Positive for dental problem and voice  change. Negative for congestion and sore throat.   Eyes:  Negative for pain and discharge.  Respiratory:  Positive for cough and shortness of breath (Exertional).   Cardiovascular:  Positive for leg swelling. Negative for chest pain and palpitations.  Gastrointestinal:  Negative for diarrhea, nausea and vomiting.  Endocrine: Negative for polydipsia and polyuria.  Genitourinary:  Negative for dysuria and hematuria.  Musculoskeletal:  Negative for neck pain and neck stiffness.  Skin:  Negative for rash.  Neurological:  Negative for dizziness, weakness, numbness and headaches.  Psychiatric/Behavioral:  Negative for agitation and behavioral problems.       Objective:     Physical Exam Vitals reviewed.  Constitutional:      General: He is not in acute distress.    Appearance: He is not diaphoretic.  HENT:     Head: Normocephalic and atraumatic.     Nose: Nose normal.     Mouth/Throat:     Mouth: Mucous membranes are moist.  Eyes:     General: No scleral icterus.    Extraocular Movements: Extraocular movements intact.  Cardiovascular:     Rate and Rhythm: Normal rate and regular rhythm.     Heart sounds: Normal heart sounds. No murmur heard. Pulmonary:     Breath sounds: Normal breath sounds. No wheezing or rales.  Abdominal:     Palpations: Abdomen is soft.     Tenderness: There is abdominal tenderness (Mild, epigastric).  Musculoskeletal:     Cervical back: Neck supple. No tenderness.     Right lower leg: No edema.     Left lower leg: No edema.  Skin:    General: Skin is warm.     Findings: No rash.  Neurological:     General: No focal deficit present.     Mental Status: He is alert and oriented to person, place, and time.     Sensory: No sensory deficit.     Motor: No weakness.  Psychiatric:        Mood  and Affect: Mood normal.        Behavior: Behavior normal.     BP 110/72 (BP Location: Left Arm)   Pulse (!) 111   Ht 5' 9.5 (1.765 m)   Wt 233 lb (105.7 kg)    SpO2 98%   BMI 33.91 kg/m  Wt Readings from Last 3 Encounters:  06/26/24 233 lb (105.7 kg)  06/03/24 237 lb (107.5 kg)  05/08/24 225 lb (102.1 kg)    Lab Results  Component Value Date   TSH 1.820 06/13/2023   Lab Results  Component Value Date   WBC 4.6 03/28/2024   HGB 8.9 (L) 03/28/2024   HCT 28.2 (L) 03/28/2024   MCV 95.3 03/28/2024   PLT 113 (L) 03/28/2024   Lab Results  Component Value Date   NA 137 03/28/2024   K 4.5 03/28/2024   CO2 21 (L) 03/28/2024   GLUCOSE 148 (H) 03/28/2024   BUN 12 03/28/2024   CREATININE 1.26 (H) 03/28/2024   BILITOT 0.2 12/29/2023   ALKPHOS 81 12/29/2023   AST 30 12/29/2023   ALT 25 12/29/2023   PROT 7.5 12/29/2023   ALBUMIN 4.1 12/29/2023   CALCIUM  9.2 03/28/2024   ANIONGAP 12 03/28/2024   EGFR 60 12/25/2023   Lab Results  Component Value Date   CHOL 129 12/25/2023   Lab Results  Component Value Date   HDL 37 (L) 12/25/2023   Lab Results  Component Value Date   LDLCALC 65 12/25/2023   Lab Results  Component Value Date   TRIG 156 (H) 12/25/2023   Lab Results  Component Value Date   CHOLHDL 3.5 12/25/2023   Lab Results  Component Value Date   HGBA1C 7.7 (A) 10/12/2023      Assessment & Plan:   Problem List Items Addressed This Visit       Cardiovascular and Mediastinum   Primary hypertension - Primary   BP Readings from Last 1 Encounters:  06/26/24 110/72   BP sometimes falls to 90s/50s Tightly controlled with with losartan -HCTZ 50-12.5 mg once daily DC HCTZ, switched to losartan  50 mg QD for now Lasix  40 mg QD instead of BID, as needed for persistent swelling only Counseled for compliance with the medications Advised DASH diet and moderate exercise/walking, at least 150 mins/week      Relevant Medications   losartan  (COZAAR ) 50 MG tablet   Other Relevant Orders   CBC with Differential/Platelet   TSH     Respiratory   Chronic obstructive pulmonary disease (HCC)   Well-controlled with Symbicort   and PRN Albuterol  Trying to cut down smoking        Digestive   Gastroesophageal reflux disease   Hoarse voice and chronic cough could be due to laryngeal reflux Has abdominal bloating Started omeprazole  40 mg QD Can take Pepcid 20 mg QPM for persistent symptoms       Relevant Medications   omeprazole  (PRILOSEC) 40 MG capsule     Endocrine   Type 2 diabetes mellitus with hyperglycemia, with long-term current use of insulin  (HCC)   Lab Results  Component Value Date   HGBA1C 7.7 (A) 10/12/2023   Uncontrolled, but improving -followed by endocrinology On Basaglar  25 units nightly, ISS and metformin  500 mg twice daily Did not tolerate GLP-1 agonists Advised to follow diabetic diet On ARB and statin F/u CMP and lipid panel Diabetic eye exam: Advised to follow up with Ophthalmology for diabetic eye exam       Relevant Medications   losartan  (  COZAAR ) 50 MG tablet   Other Relevant Orders   CMP14+EGFR   Hemoglobin A1c   Type 2 diabetes mellitus with diabetic neuropathy, with long-term current use of insulin  (HCC)   Uncontrolled Continue Cymbalta 60 mg QD for now Increased gabapentin  to 300 mg QAM and 600 mg qHS recently      Relevant Medications   losartan  (COZAAR ) 50 MG tablet     Genitourinary   Benign prostatic hyperplasia with urinary hesitancy   Chronic urinary hesitancy and retention likely due to BPH Likely has overflow incontinence Continue alfuzosin  10 mg QD DC tamsulosin  as, he reports today that he did not get adequate response Followed by urology - s/p dilation of urethral stricture      Relevant Medications   alfuzosin  (UROXATRAL ) 10 MG 24 hr tablet   Other Relevant Orders   PSA     Other   HLD (hyperlipidemia)   On Crestor  considering his  history of type II DM      Relevant Medications   losartan  (COZAAR ) 50 MG tablet   Other Relevant Orders   Lipid Profile   Leg swelling   Likely due to chronic venous insufficiency, check echocardiogram to  rule out cardiac etiology Advised to perform leg elevation and use compression socks as tolerated Advised to cut down salt intake Lasix  as needed for persistent swelling, advised to take 40 mg QD instead of BID due to hypotensive spells and concern for AKI      Relevant Orders   ECHOCARDIOGRAM COMPLETE   Dyspnea on exertion   Exertional dyspnea with leg swelling, concerning for cardiac etiology - check echocardiogram Has history of COPD, uses Symbicort  and as needed albuterol  inhaler       Relevant Orders   ECHOCARDIOGRAM COMPLETE     Meds ordered this encounter  Medications   losartan  (COZAAR ) 50 MG tablet    Sig: Take 1 tablet (50 mg total) by mouth daily.    Dispense:  90 tablet    Refill:  1    Discontinue Losartan -HCTZ   omeprazole  (PRILOSEC) 40 MG capsule    Sig: Take 1 capsule (40 mg total) by mouth daily.    Dispense:  30 capsule    Refill:  5    Follow-up: Return in about 4 months (around 10/24/2024) for DM.    Suzzane MARLA Blanch, MD

## 2024-06-26 NOTE — Assessment & Plan Note (Addendum)
 Chronic urinary hesitancy and retention likely due to BPH Likely has overflow incontinence Continue alfuzosin  10 mg QD DC tamsulosin  as, he reports today that he did not get adequate response Followed by urology - s/p dilation of urethral stricture

## 2024-06-26 NOTE — Assessment & Plan Note (Addendum)
 Uncontrolled Continue Cymbalta 60 mg QD for now Increased gabapentin  to 300 mg QAM and 600 mg qHS recently

## 2024-06-26 NOTE — Assessment & Plan Note (Addendum)
 Lab Results  Component Value Date   HGBA1C 7.7 (A) 10/12/2023   Uncontrolled, but improving -followed by endocrinology On Basaglar  25 units nightly, ISS and metformin  500 mg twice daily Did not tolerate GLP-1 agonists Advised to follow diabetic diet On ARB and statin F/u CMP and lipid panel Diabetic eye exam: Advised to follow up with Ophthalmology for diabetic eye exam

## 2024-06-28 ENCOUNTER — Other Ambulatory Visit: Payer: Self-pay | Admitting: Internal Medicine

## 2024-06-28 DIAGNOSIS — M7989 Other specified soft tissue disorders: Secondary | ICD-10-CM

## 2024-06-28 DIAGNOSIS — R0609 Other forms of dyspnea: Secondary | ICD-10-CM

## 2024-06-28 NOTE — Assessment & Plan Note (Signed)
 Exertional dyspnea with leg swelling, concerning for cardiac etiology - check echocardiogram Has history of COPD, uses Symbicort  and as needed albuterol  inhaler

## 2024-06-28 NOTE — Assessment & Plan Note (Signed)
 Hoarse voice and chronic cough could be due to laryngeal reflux Has abdominal bloating Started omeprazole  40 mg QD Can take Pepcid 20 mg QPM for persistent symptoms

## 2024-06-28 NOTE — Assessment & Plan Note (Signed)
 Likely due to chronic venous insufficiency, check echocardiogram to rule out cardiac etiology Advised to perform leg elevation and use compression socks as tolerated Advised to cut down salt intake Lasix  as needed for persistent swelling, advised to take 40 mg QD instead of BID due to hypotensive spells and concern for AKI

## 2024-06-28 NOTE — Assessment & Plan Note (Signed)
 On Crestor  considering his  history of type II DM

## 2024-06-29 LAB — MICROALBUMIN / CREATININE URINE RATIO
Creatinine, Urine: 48.9 mg/dL
Microalb/Creat Ratio: 72 mg/g{creat} — ABNORMAL HIGH (ref 0–29)
Microalbumin, Urine: 35.2 ug/mL

## 2024-07-02 ENCOUNTER — Other Ambulatory Visit (HOSPITAL_COMMUNITY)
Admission: RE | Admit: 2024-07-02 | Discharge: 2024-07-02 | Disposition: A | Discharge status: Home / Self Care | Source: Ambulatory Visit | Attending: Internal Medicine | Admitting: Internal Medicine

## 2024-07-02 ENCOUNTER — Other Ambulatory Visit: Payer: Self-pay | Admitting: Internal Medicine

## 2024-07-02 ENCOUNTER — Ambulatory Visit: Payer: Self-pay | Admitting: Internal Medicine

## 2024-07-02 DIAGNOSIS — I10 Essential (primary) hypertension: Secondary | ICD-10-CM | POA: Diagnosis not present

## 2024-07-02 DIAGNOSIS — E782 Mixed hyperlipidemia: Secondary | ICD-10-CM | POA: Diagnosis not present

## 2024-07-02 DIAGNOSIS — N401 Enlarged prostate with lower urinary tract symptoms: Secondary | ICD-10-CM | POA: Insufficient documentation

## 2024-07-02 DIAGNOSIS — Z794 Long term (current) use of insulin: Secondary | ICD-10-CM | POA: Insufficient documentation

## 2024-07-02 DIAGNOSIS — R7401 Elevation of levels of liver transaminase levels: Secondary | ICD-10-CM

## 2024-07-02 DIAGNOSIS — R3911 Hesitancy of micturition: Secondary | ICD-10-CM | POA: Diagnosis not present

## 2024-07-02 DIAGNOSIS — E1140 Type 2 diabetes mellitus with diabetic neuropathy, unspecified: Secondary | ICD-10-CM | POA: Diagnosis present

## 2024-07-02 DIAGNOSIS — E1165 Type 2 diabetes mellitus with hyperglycemia: Secondary | ICD-10-CM | POA: Insufficient documentation

## 2024-07-02 LAB — COMPREHENSIVE METABOLIC PANEL WITH GFR
ALT: 73 U/L — ABNORMAL HIGH (ref 0–44)
AST: 81 U/L — ABNORMAL HIGH (ref 15–41)
Albumin: 4.2 g/dL (ref 3.5–5.0)
Alkaline Phosphatase: 147 U/L — ABNORMAL HIGH (ref 38–126)
Anion gap: 9 (ref 5–15)
BUN: 12 mg/dL (ref 8–23)
CO2: 26 mmol/L (ref 22–32)
Calcium: 9.3 mg/dL (ref 8.9–10.3)
Chloride: 105 mmol/L (ref 98–111)
Creatinine, Ser: 1.41 mg/dL — ABNORMAL HIGH (ref 0.61–1.24)
GFR, Estimated: 57 mL/min — ABNORMAL LOW (ref >60)
Glucose, Bld: 104 mg/dL — ABNORMAL HIGH (ref 70–99)
Potassium: 4.9 mmol/L (ref 3.5–5.1)
Sodium: 140 mmol/L (ref 135–145)
Total Bilirubin: 0.3 mg/dL (ref 0.0–1.2)
Total Protein: 7 g/dL (ref 6.5–8.1)

## 2024-07-02 LAB — HEMOGLOBIN A1C
Hgb A1c MFr Bld: 7.7 % — ABNORMAL HIGH (ref 4.8–5.6)
Mean Plasma Glucose: 174 mg/dL

## 2024-07-02 LAB — CBC WITH DIFFERENTIAL/PLATELET
Abs Immature Granulocytes: 0.01 K/uL (ref 0.00–0.07)
Basophils Absolute: 0 K/uL (ref 0.0–0.1)
Basophils Relative: 1 %
Eosinophils Absolute: 0.5 K/uL (ref 0.0–0.5)
Eosinophils Relative: 11 %
HCT: 29.2 % — ABNORMAL LOW (ref 39.0–52.0)
Hemoglobin: 9.3 g/dL — ABNORMAL LOW (ref 13.0–17.0)
Immature Granulocytes: 0 %
Lymphocytes Relative: 33 %
Lymphs Abs: 1.4 K/uL (ref 0.7–4.0)
MCH: 30.4 pg (ref 26.0–34.0)
MCHC: 31.8 g/dL (ref 30.0–36.0)
MCV: 95.4 fL (ref 80.0–100.0)
Monocytes Absolute: 0.3 K/uL (ref 0.1–1.0)
Monocytes Relative: 8 %
Neutro Abs: 2.1 K/uL (ref 1.7–7.7)
Neutrophils Relative %: 47 %
Platelets: 115 K/uL — ABNORMAL LOW (ref 150–400)
RBC: 3.06 MIL/uL — ABNORMAL LOW (ref 4.22–5.81)
RDW: 13.3 % (ref 11.5–15.5)
WBC: 4.4 K/uL (ref 4.0–10.5)
nRBC: 0 % (ref 0.0–0.2)

## 2024-07-02 LAB — LIPID PANEL
Cholesterol: 94 mg/dL (ref 0–200)
HDL: 31 mg/dL — ABNORMAL LOW (ref >40)
LDL Cholesterol: 46 mg/dL (ref 0–99)
Total CHOL/HDL Ratio: 3.1 ratio
Triglycerides: 86 mg/dL (ref <150)
VLDL: 17 mg/dL (ref 0–40)

## 2024-07-02 LAB — TSH: TSH: 3.41 u[IU]/mL (ref 0.350–4.500)

## 2024-07-02 LAB — PSA: Prostatic Specific Antigen: 0.5 ng/mL (ref 0.00–4.00)

## 2024-07-10 ENCOUNTER — Other Ambulatory Visit: Payer: Self-pay | Admitting: Internal Medicine

## 2024-07-24 ENCOUNTER — Encounter: Payer: Self-pay | Admitting: Nurse Practitioner

## 2024-07-24 ENCOUNTER — Other Ambulatory Visit: Payer: Self-pay | Admitting: Internal Medicine

## 2024-07-24 ENCOUNTER — Ambulatory Visit: Admitting: Nurse Practitioner

## 2024-07-24 VITALS — BP 116/80 | HR 62 | Ht 69.5 in | Wt 227.4 lb

## 2024-07-24 DIAGNOSIS — E1165 Type 2 diabetes mellitus with hyperglycemia: Secondary | ICD-10-CM

## 2024-07-24 DIAGNOSIS — Z794 Long term (current) use of insulin: Secondary | ICD-10-CM

## 2024-07-24 DIAGNOSIS — E559 Vitamin D deficiency, unspecified: Secondary | ICD-10-CM | POA: Diagnosis not present

## 2024-07-24 DIAGNOSIS — I10 Essential (primary) hypertension: Secondary | ICD-10-CM | POA: Diagnosis not present

## 2024-07-24 DIAGNOSIS — Z7985 Long-term (current) use of injectable non-insulin antidiabetic drugs: Secondary | ICD-10-CM | POA: Diagnosis not present

## 2024-07-24 DIAGNOSIS — E782 Mixed hyperlipidemia: Secondary | ICD-10-CM | POA: Diagnosis not present

## 2024-07-24 DIAGNOSIS — Z7984 Long term (current) use of oral hypoglycemic drugs: Secondary | ICD-10-CM

## 2024-07-24 MED ORDER — BASAGLAR KWIKPEN 100 UNIT/ML ~~LOC~~ SOPN: 14.0000 [IU] | PEN_INJECTOR | Freq: Every day | SUBCUTANEOUS | 3 refills | Status: AC

## 2024-07-24 MED ORDER — DEXCOM G7 SENSOR MISC: 1.0000 | 3 refills | Status: AC

## 2024-07-24 MED ORDER — METFORMIN HCL 500 MG PO TABS: 500.0000 mg | ORAL_TABLET | Freq: Two times a day (BID) | ORAL | 1 refills | Status: DC

## 2024-07-24 NOTE — Progress Notes (Signed)
 Endocrinology Follow Up Note       07/24/2024, 4:19 PM   Subjective:    Patient ID: Dustin Moore, male    DOB: November 11, 1960.  Dustin Moore is being seen in follow up after being seen in consultation for management of currently uncontrolled symptomatic diabetes requested by  Tobie Suzzane POUR, MD.   Past Medical History:  Diagnosis Date   Asthma    Diabetes mellitus without complication (HCC)    type 2   Hypertension    Rotator cuff disorder, left     Past Surgical History:  Procedure Laterality Date   CYSTOSCOPY WITH RETROGRADE URETHROGRAM N/A 03/28/2024   Procedure: CYSTOSCOPY WITH RETROGRADE URETHROGRAM;  Surgeon: Twylla Glendia BROCKS, MD;  Location: [facility] OR[facility];  Service: Urology;  Laterality: N/A;   ROTATOR CUFF REPAIR Left 08/2021   TONSILLECTOMY AND ADENOIDECTOMY     TOOTH EXTRACTION N/A 12/15/2023   Procedure: Dental extractions of two, six, seven, eight, nine, eleven, thirteen, fifteen, twenty, twenty-one, twenty-two, twenty-three, twenty-four, twenty-five, twenty-six, twenty-seven, twenty-eight, twenty-nine, aveoloplasty, removal of bilateral mandibular lingual tori;  Surgeon: Sheryle Glendia, DMD;  Location: [facility];  Service: Oral Surgery;  Laterality: N/A;   TOTAL HIP ARTHROPLASTY Left 2013    Social History   Socioeconomic History   Marital status: Single    Spouse name: Not on file   Number of children: Not on file   Years of education: Not on file   Highest education level: Some college, no degree  Occupational History   Not on file  Tobacco Use   Smoking status: Every Day    Current packs/day: 0.50    Average packs/day: 0.5 packs/day for 39.0 years (19.5 ttl pk-yrs)    Types: Cigarettes   Smokeless tobacco: Never   Tobacco comments:    5 cigarettes per day as of 06/03/24  Vaping Use   Vaping status: Never Used  Substance and Sexual Activity   Alcohol use: Not Currently    Comment: occasinally on  weekends    Drug use: Not Currently    Types: Marijuana, Cocaine    Comment: last percocet use 01/2022 (no MJ or cocaine since he was in his 30s)   Sexual activity: Yes  Other Topics Concern   Not on file  Social History Narrative   Not on file   Social Drivers of Health   Tobacco Use: High Risk (07/24/2024)   Patient History    Smoking Tobacco Use: Every Day    Smokeless Tobacco Use: Never    Passive Exposure: Not on file  Financial Resource Strain: Low Risk (06/03/2024)   Overall Financial Resource Strain (CARDIA)    Difficulty of Paying Living Expenses: Not hard at all  Food Insecurity: No Food Insecurity (06/03/2024)   Epic    Worried About Radiation Protection Practitioner of Food in the Last Year: Never true    Ran Out of Food in the Last Year: Never true  Transportation Needs: No Transportation Needs (06/03/2024)   Epic    Lack of Transportation (Medical): No    Lack of Transportation (Non-Medical): No  Physical Activity: Unknown (06/03/2024)   Exercise Vital Sign    Days of Exercise per Week: Patient declined  Minutes of Exercise per Session: Not on file  Stress: No Stress Concern Present (06/03/2024)   [employer] of Occupational Health - Occupational Stress Questionnaire    Feeling of Stress: Not at all  Social Connections: Moderately Integrated (06/03/2024)   Social Connection and Isolation Panel    Frequency of Communication with Friends and Family: More than three times a week    Frequency of Social Gatherings with Friends and Family: Twice a week    Attends Religious Services: 1 to 4 times per year    Active Member of [employer] or Organizations: No    Attends Engineer, Structural: Not on file    Marital Status: Living with partner  Depression (PHQ2-9): Low Risk (06/03/2024)   Depression (PHQ2-9)    PHQ-2 Score: 0  Alcohol Screen: Low Risk (06/03/2024)   Alcohol Screen    Last Alcohol Screening Score (AUDIT): 0  Housing: Low Risk (06/03/2024)   Epic    Unable to  Pay for Housing in the Last Year: No    Number of Times Moved in the Last Year: 0    Homeless in the Last Year: No  Utilities: Not on file  Health Literacy: Not on file    Family History  Problem Relation Age of Onset   Diabetes Mother    Cirrhosis Mother    Heart disease Father    Heart attack Father    Heart disease Paternal Uncle     Outpatient Encounter Medications as of 07/24/2024  Medication Sig   alfuzosin  (UROXATRAL ) 10 MG 24 hr tablet TAKE 1 TABLET(10 MG) BY MOUTH DAILY WITH BREAKFAST   aspirin EC 81 MG tablet Take 81 mg by mouth daily.   B Complex-C (B-COMPLEX WITH VITAMIN C) tablet Take 1 tablet by mouth daily.   DULoxetine (CYMBALTA) 60 MG capsule TAKE 1 CAPSULE BY MOUTH DAILY   furosemide  (LASIX ) 40 MG tablet TAKE 1 TABLET(40 MG) BY MOUTH DAILY   ipratropium-albuterol  (DUONEB) 0.5-2.5 (3) MG/3ML SOLN USE 1 VIAL VIA NEBULIZER EVERY 4 HOURS AS NEEDED FOR WHEEZING OR SHORTNESS OF BREATH.   losartan  (COZAAR ) 50 MG tablet Take 1 tablet (50 mg total) by mouth daily.   MAGNESIUM PO Take 500 mg by mouth daily.   methadone (DOLOPHINE) 10 MG/5ML solution Take 80 mLs by mouth daily.   Omega 3-6-9 Fatty Acids (TRIPLE OMEGA COMPLEX PO) Take 1 capsule by mouth daily.   omeprazole  (PRILOSEC) 40 MG capsule Take 1 capsule (40 mg total) by mouth daily.   ondansetron  (ZOFRAN ) 4 MG tablet TAKE 1 TABLET(4 MG) BY MOUTH EVERY 8 HOURS AS NEEDED FOR NAUSEA OR VOMITING   Potassium 99 MG TABS Take 99 mg by mouth daily.   rosuvastatin  (CRESTOR ) 5 MG tablet TAKE 1 TABLET(5 MG) BY MOUTH EVERY EVENING   SYMBICORT  160-4.5 MCG/ACT inhaler Inhale 2 puffs into the lungs 2 (two) times daily. in the morning and at bedtime.   traZODone  (DESYREL ) 50 MG tablet Take 0.5-1 tablets (25-50 mg total) by mouth at bedtime as needed for sleep.   VENTOLIN  HFA 108 (90 Base) MCG/ACT inhaler Inhale 1-2 puffs into the lungs every 6 (six) hours as needed for wheezing or shortness of breath.   [DISCONTINUED] Continuous  Glucose Sensor (DEXCOM G7 SENSOR) MISC Inject 1 Application into the skin as directed. Change sensor every 10 days as directed.   [DISCONTINUED] Insulin  Glargine (BASAGLAR  KWIKPEN) 100 UNIT/ML Inject 16 Units into the skin at bedtime. (Patient taking differently: Inject 14 Units into the skin at  bedtime.)   [DISCONTINUED] insulin  lispro (HUMALOG  KWIKPEN) 100 UNIT/ML KwikPen Inject 5-11 Units into the skin 3 (three) times daily. (Patient taking differently: Inject 6-12 Units into the skin 3 (three) times daily.)   [DISCONTINUED] metFORMIN  (GLUCOPHAGE ) 500 MG tablet TAKE 1 TABLET(500 MG) BY MOUTH TWICE DAILY WITH A MEAL   Continuous Glucose Sensor (DEXCOM G7 SENSOR) MISC Inject 1 Application into the skin as directed. Change sensor every 10 days as directed.   gabapentin  (NEURONTIN ) 300 MG capsule Take 1 capsule (300 mg total) by mouth 3 (three) times daily.   Insulin  Glargine (BASAGLAR  KWIKPEN) 100 UNIT/ML Inject 14 Units into the skin at bedtime.   insulin  lispro (HUMALOG  KWIKPEN) 100 UNIT/ML KwikPen Inject 4-10 Units into the skin 3 (three) times daily.   metFORMIN  (GLUCOPHAGE ) 500 MG tablet Take 1 tablet (500 mg total) by mouth 2 (two) times daily with a meal.   [DISCONTINUED] alfuzosin  (UROXATRAL ) 10 MG 24 hr tablet Take 10 mg by mouth daily with breakfast.   No facility-administered encounter medications on file as of 07/24/2024.    ALLERGIES: Allergies  Allergen Reactions   Contrast Media [Iodinated Contrast Media] Anaphylaxis    VACCINATION STATUS: Immunization History  Administered Date(s) Administered   Fluzone Influenza virus vaccine,trivalent (IIV3), split virus 05/01/2013, 07/01/2015, 07/13/2017, 07/02/2018   Influenza, Seasonal, Injecte, Preservative Fre 06/13/2023, 06/03/2024   Influenza,inj,Quad PF,6+ Mos 07/08/2020   PFIZER(Purple Top)SARS-COV-2 Vaccination 11/14/2019, 12/06/2019   Zoster Recombinant(Shingrix ) 06/13/2023    Diabetes He presents for his follow-up diabetic  visit. He has type 2 diabetes mellitus. Onset time: diagnosed at approx age of 13. His disease course has been improving. Hypoglycemia symptoms include nervousness/anxiousness, sweats and tremors. There are no diabetic associated symptoms. Hypoglycemia complications include nocturnal hypoglycemia. Symptoms are stable. 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 Risk factors for coronary artery disease include diabetes mellitus, dyslipidemia, family history, obesity, male sex, hypertension, sedentary lifestyle and tobacco exposure. Current diabetic treatment includes intensive insulin  program and oral agent (monotherapy). He is compliant with treatment most of the time. His weight is fluctuating minimally. He is following a generally healthy diet. When asked about meal planning, he reported none. He has not had a previous visit with a dietitian. He participates in exercise intermittently. His home blood glucose trend is decreasing steadily. His overall blood glucose range is 130-140 mg/dl. (He presents today, accompanied by his wife, with his CGM showing greatly improved glycemic profile.  His most recent A1c on 11/25 was 7.7%, increasing from last visit of 7.4%.  Analysis of his CGM shows TIR 83%, TAR 14%, TBR 3% with a GMI of 6.6%.  He is undergoing echo soon to evaluate reason for edema.) An ACE inhibitor/angiotensin II receptor blocker is being taken. He does not see a podiatrist.Eye exam is not current.     Review of systems  Constitutional: + decreasing body weight,  current Body mass index is 33.1 kg/m. , no fatigue, no subjective hyperthermia, no subjective hypothermia Eyes: no blurry vision, no xerophthalmia ENT: no sore throat, no nodules palpated in throat, no dysphagia/odynophagia, no hoarseness Cardiovascular: no chest pain, no shortness of breath, no palpitations, + leg swelling-having echo soon Respiratory: no cough, no shortness of breath Gastrointestinal: no  nausea/vomiting/diarrhea Musculoskeletal: no muscle/joint aches Skin: no rashes, no hyperemia Neurological: no tremors, no numbness, no tingling, no dizziness Psychiatric: no depression, no anxiety  Objective:     BP 116/80 (BP Location: Left Arm, Patient Position: Sitting, Cuff Size: Large)   Pulse 62  Ht 5' 9.5 (1.765 m)   Wt 227 lb 6.4 oz (103.1 kg)   BMI 33.10 kg/m   Wt Readings from Last 3 Encounters:  07/24/24 227 lb 6.4 oz (103.1 kg)  06/26/24 233 lb (105.7 kg)  06/03/24 237 lb (107.5 kg)     BP Readings from Last 3 Encounters:  07/24/24 116/80  06/26/24 110/72  06/03/24 (!) 108/57      Physical Exam- Limited  Constitutional:  Body mass index is 33.1 kg/m. , not in acute distress, normal state of mind Eyes:  EOMI, no exophthalmos Musculoskeletal: no gross deformities, strength intact in all four extremities, no gross restriction of joint movements Skin:  no rashes, no hyperemia Neurological: no tremor with outstretched hands   Diabetic Foot Exam - Simple   No data filed      CMP ( most recent) CMP     Component Value Date/Time   NA 140 07/02/2024 0828   NA 139 12/25/2023 1356   K 4.9 07/02/2024 0828   CL 105 07/02/2024 0828   CO2 26 07/02/2024 0828   GLUCOSE 104 (H) 07/02/2024 0828   BUN 12 07/02/2024 0828   BUN 13 12/25/2023 1356   CREATININE 1.41 (H) 07/02/2024 0828   CALCIUM  9.3 07/02/2024 0828   PROT 7.0 07/02/2024 0828   PROT 6.5 12/25/2023 1356   ALBUMIN 4.2 07/02/2024 0828   ALBUMIN 4.2 12/25/2023 1356   AST 81 (H) 07/02/2024 0828   ALT 73 (H) 07/02/2024 0828   ALKPHOS 147 (H) 07/02/2024 0828   BILITOT 0.3 07/02/2024 0828   BILITOT <0.2 12/25/2023 1356   EGFR 60 12/25/2023 1356   GFRNONAA 57 (L) 07/02/2024 0828     Diabetic Labs (most recent): Lab Results  Component Value Date   HGBA1C 7.7 (H) 07/02/2024   HGBA1C 7.7 (A) 10/12/2023   HGBA1C 8.2 (A) 06/16/2023     Lipid Panel ( most recent) Lipid Panel     Component  Value Date/Time   CHOL 94 07/02/2024 0828   CHOL 129 12/25/2023 1356   TRIG 86 07/02/2024 0828   HDL 31 (L) 07/02/2024 0828   HDL 37 (L) 12/25/2023 1356   CHOLHDL 3.1 07/02/2024 0828   VLDL 17 07/02/2024 0828   LDLCALC 46 07/02/2024 0828   LDLCALC 65 12/25/2023 1356   LABVLDL 27 12/25/2023 1356      Lab Results  Component Value Date   TSH 3.410 07/02/2024   TSH 1.820 06/13/2023           Assessment & Plan:   1) Type 2 diabetes mellitus with hyperglycemia, with long-term current use of insulin  (HCC)  He presents today, accompanied by his wife, with his CGM showing greatly improved glycemic profile.  His most recent A1c on 11/25 was 7.7%, increasing from last visit of 7.4%.  Analysis of his CGM shows TIR 83%, TAR 14%, TBR 3% with a GMI of 6.6%.  He is undergoing echo soon to evaluate reason for edema.  - Dustin Moore has currently uncontrolled symptomatic type 2 DM since 63 years of age.   -Recent labs reviewed.  - I had a long discussion with him about the progressive nature of diabetes and the pathology behind its complications. -his diabetes is complicated by neuropathy and mild CKD and he remains at a high risk for more acute and chronic complications which include CAD, CVA, CKD, retinopathy, and neuropathy. These are all discussed in detail with him.  The following Lifestyle Medicine recommendations according to [employer] of  Lifestyle Medicine [facility]) were discussed and offered to patient and he agrees to start the journey:  A. Whole Foods, Plant-based plate comprising of fruits and vegetables, plant-based proteins, whole-grain carbohydrates was discussed in detail with the patient.   A list for source of those nutrients were also provided to the patient.  Patient will use only water  or unsweetened tea for hydration. B.  The need to stay away from risky substances including alcohol, smoking; obtaining 7 to 9 hours of restorative sleep, at least 150 minutes of moderate  intensity exercise weekly, the importance of healthy social connections,  and stress reduction techniques were discussed. C.  A full color page of  Calorie density of various food groups per pound showing examples of each food groups was provided to the patient.  - Nutritional counseling repeated/built upon at each appointment.  - The patient admits there is a room for improvement in their diet and drink choices. -  Suggestion is made for the patient to avoid simple carbohydrates from their diet including Cakes, Sweet Desserts / Pastries, Ice Cream, Soda (diet and regular), Sweet Tea, Candies, Chips, Cookies, Sweet Pastries, Store Bought Juices, Alcohol in Excess of 1-2 drinks a day, Artificial Sweeteners, Coffee Creamer, and Sugar-free Products. This will help patient to have stable blood glucose profile and potentially avoid unintended weight gain.   - I encouraged the patient to switch to unprocessed or minimally processed complex starch and increased protein intake (animal or plant source), fruits, and vegetables.   - Patient is advised to stick to a routine mealtimes to eat 3 meals a day and avoid unnecessary snacks (to snack only to correct hypoglycemia).  - he will be scheduled with Penny Crumpton, RDN, CDE for diabetes education.  - I have approached him with the following individualized plan to manage his diabetes and patient agrees:   -He is advised to continue his Basaglar  14 units SQ nightly and lower his Humalog  to 4-10 units TID with meals if glucose is above 90 and he is eating (Specific instructions on how to titrate insulin  dosage based on glucose readings given to patient in writing). He can also continue his Metformin  500 mg po twice daily after meals.  We did talk about potentially getting him off Metformin  and trying an SGLT2i which would positively impact his heart and kidneys, however he wants to do more research first.  -he is encouraged to continue monitoring glucose 4  times daily (using his CGM), before meals and before bed, and to call the clinic if he has readings less than 70 or above 300 for 3 tests in a row.    - he is warned not to take insulin  without proper monitoring per orders. - Adjustment parameters are given to him for hypo and hyperglycemia in writing.  -He did not tolerate GLP1 in the past (he is high risk for development of pancreatitis on such medication given heavy smoking history).  - Specific targets for  A1c; LDL, HDL, and Triglycerides were discussed with the patient.  2) Blood Pressure /Hypertension:  his blood pressure is controlled to target.   he is advised to continue his current medications as prescribed by PCP.  3) Lipids/Hyperlipidemia:    Review of his recent lipid panel from 07/02/24 showed controlled LDL at 46.  He is currently on Crestor  5 mg po daily.  4)  Weight/Diet:  his Body mass index is 33.1 kg/m.  -  clearly complicating his diabetes care.   he is a candidate  for weight loss. I discussed with him the fact that loss of 5 - 10% of his  current body weight will have the most impact on his diabetes management.  Exercise, and detailed carbohydrates information provided  -  detailed on discharge instructions.  5) Chronic Care/Health Maintenance: -he is on ACEI/ARB and not on Statin medications and is encouraged to initiate and continue to follow up with Ophthalmology, Dentist, Podiatrist at least yearly or according to recommendations, and advised to QUIT SMOKING. I have recommended yearly flu vaccine and pneumonia vaccine at least every 5 years; moderate intensity exercise for up to 150 minutes weekly; and sleep for at least 7 hours a day.  - he is advised to maintain close follow up with Tobie Suzzane POUR, MD for primary care needs, as well as his other providers for optimal and coordinated care.     I spent  51  minutes in the care of the patient today including review of labs from CMP, Lipids, Thyroid  Function,  Hematology (current and previous including abstractions from other facilities); face-to-face time discussing  his blood glucose readings/logs, discussing hypoglycemia and hyperglycemia episodes and symptoms, medications doses, his options of short and long term treatment based on the latest standards of care / guidelines;  discussion about incorporating lifestyle medicine;  and documenting the encounter. Risk reduction counseling performed per USPSTF guidelines to reduce obesity and cardiovascular risk factors.     Please refer to Patient Instructions for Blood Glucose Monitoring and Insulin /Medications Dosing Guide  in media tab for additional information. Please  also refer to  Patient Self Inventory in the Media  tab for reviewed elements of pertinent patient history.  Dustin Moore participated in the discussions, expressed understanding, and voiced agreement with the above plans.  All questions were answered to his satisfaction. he is encouraged to contact clinic should he have any questions or concerns prior to his return visit.     Follow up plan: - Return in about 4 months (around 11/22/2024) for Diabetes F/U with A1c in office, No previsit labs, Bring meter and logs.   Benton Rio, [facility] [address] [place], [place] [address] Phone: [phone] Fax: [phone]  07/24/2024, 4:19 PM

## 2024-07-26 ENCOUNTER — Ambulatory Visit: Admitting: Nurse Practitioner

## 2024-07-29 ENCOUNTER — Ambulatory Visit (HOSPITAL_COMMUNITY)
Admission: RE | Admit: 2024-07-29 | Discharge: 2024-07-29 | Disposition: A | Discharge status: Home / Self Care | Source: Ambulatory Visit | Attending: Internal Medicine | Admitting: Internal Medicine

## 2024-07-29 ENCOUNTER — Ambulatory Visit: Payer: Self-pay | Admitting: Internal Medicine

## 2024-07-29 DIAGNOSIS — R0600 Dyspnea, unspecified: Secondary | ICD-10-CM | POA: Diagnosis not present

## 2024-07-29 DIAGNOSIS — M7989 Other specified soft tissue disorders: Secondary | ICD-10-CM | POA: Diagnosis present

## 2024-07-29 DIAGNOSIS — R0609 Other forms of dyspnea: Secondary | ICD-10-CM | POA: Insufficient documentation

## 2024-07-29 LAB — ECHOCARDIOGRAM COMPLETE
Area-P 1/2: 3.37 cm2
S' Lateral: 3.2 cm

## 2024-07-29 NOTE — Progress Notes (Signed)
*  PRELIMINARY RESULTS* Echocardiogram 2D Echocardiogram has been performed.  Teresa Aida PARAS 07/29/2024, 1:38 PM

## 2024-08-05 ENCOUNTER — Other Ambulatory Visit: Payer: Self-pay | Admitting: Internal Medicine

## 2024-08-05 DIAGNOSIS — R110 Nausea: Secondary | ICD-10-CM

## 2024-08-06 ENCOUNTER — Other Ambulatory Visit: Payer: Self-pay | Admitting: Internal Medicine

## 2024-08-06 ENCOUNTER — Ambulatory Visit: Payer: Self-pay

## 2024-08-06 NOTE — Telephone Encounter (Signed)
Noted patient scheduled

## 2024-08-06 NOTE — Telephone Encounter (Signed)
 FYI Only or Action Required?: FYI only for provider: appointment scheduled on 12/31.  Patient was last seen in primary care on 06/26/2024 by Tobie Suzzane POUR, MD.  Called Nurse Triage reporting Wound Infection (Foot sore). -generally improving but would like to be seen, still having mild pain  Symptoms began several days ago.  Interventions attempted: OTC medications: Neosporin, Epsom Salt Soaks and Rest, hydration, or home remedies.  Symptoms are: gradually improving.  Triage Disposition: See Physician Within 24 Hours  Patient/caregiver understands and will follow disposition?: Yes   Copied from CRM #[id number]. Topic: Clinical - Red Word Triage >> Aug 06, 2024  1:43 PM Emylou G wrote: [facility] that prompted transfer to Nurse Triage: right foot is swollen ( diabetic ) he has puss on his foot ( sore that got worse after bandaid was pulled off ) draining - underneath the little toe on the side - can barely walk, a little warm, sore    Reason for Disposition  Foot or toe pain  Additional Information  Negative: Foreign body in skin (e.g., splinter, sliver)    Didn't see any splinters, saw little hair when draining that came out.  Thought possibly a sliver of metal due to work, but this came out and didn't attach to magnet.  Answer Assessment - Initial Assessment Questions SYMPTOM: What's the main symptom you're concerned about? (e.g., rash, sore, callus, drainage, numbness)  Right foot sore opened up, history of diabetes.  Pus/drainage was clear, no color Was never hot, just felt a little warm.   Started with a small black dot that rubbed off.  Covered area with band aide for protection and next day when removing band aide was filled with clear pus. Kept clean, covered and did Epsom salt soaks and neosporin.  Overall looking better now but would like to be seen.  Is now sealed over and not hurting as much as it did.   LOCATION: Where is the  right foot located? (e.g., foot/toe,  top/bottom, left/right)     Top of R) foot has been a little warm  APPEARANCE: What does the area look like? (e.g., normal, red, swollen; size)     Swelling pretty much gone down now, not as warm or sore but still wants to be seen.   ONSET: When did the  black dot  appear?     3 days ago, Friday night  PAIN: Is there any pain? If Yes, ask: How bad is it? (Scale: 0-10; none, mild, moderate, severe)     Sore on side of foot by little toe with little black spot that came off really easily (not a scab or down deep).   Felt like needles sticking in area. After putting band aide on it to cushion it, when pulling band aides off- pus in band aides like skin broke right there.   Pain today rated- little sore, 5/10 (2 days ago it was a 10/10)   BLOOD GLUCOSE: What is your blood glucose level?  Dexcom- readings today normal, 110, 114, doing really well  During call- 158 Taking day time insulin  Humalog , Basaglar  at night   OTHER SYMPTOMS: Do you have any other symptoms? (e.g., fever, weakness)     Denies fever, chills; edema in ankles- being treated for this; able to move/ bend foot and bear weight  ECHO recent for heart- fine, no fluid thus does not relate swelling recently to heart; keeping legs/feet propped up, taking diuretic, wearing diabetic socks. No significant swelling today.  Protocols  used: Diabetes - Foot Problems and Questions-A-AH

## 2024-08-07 ENCOUNTER — Encounter: Payer: Self-pay | Admitting: Family Medicine

## 2024-08-07 ENCOUNTER — Other Ambulatory Visit: Payer: Self-pay | Admitting: Internal Medicine

## 2024-08-07 ENCOUNTER — Ambulatory Visit: Admitting: Family Medicine

## 2024-08-07 VITALS — BP 113/69 | HR 87 | Resp 16 | Ht 69.5 in | Wt 228.0 lb

## 2024-08-07 DIAGNOSIS — R490 Dysphonia: Secondary | ICD-10-CM | POA: Diagnosis not present

## 2024-08-07 DIAGNOSIS — Z720 Tobacco use: Secondary | ICD-10-CM

## 2024-08-07 DIAGNOSIS — L089 Local infection of the skin and subcutaneous tissue, unspecified: Secondary | ICD-10-CM | POA: Diagnosis not present

## 2024-08-07 DIAGNOSIS — S90812A Abrasion, left foot, initial encounter: Secondary | ICD-10-CM | POA: Diagnosis not present

## 2024-08-07 DIAGNOSIS — S90811A Abrasion, right foot, initial encounter: Secondary | ICD-10-CM | POA: Diagnosis not present

## 2024-08-07 DIAGNOSIS — B351 Tinea unguium: Secondary | ICD-10-CM | POA: Diagnosis not present

## 2024-08-07 DIAGNOSIS — I10 Essential (primary) hypertension: Secondary | ICD-10-CM | POA: Diagnosis not present

## 2024-08-07 DIAGNOSIS — Z23 Encounter for immunization: Secondary | ICD-10-CM | POA: Diagnosis not present

## 2024-08-07 MED ORDER — AMOXICILLIN-POT CLAVULANATE 875-125 MG PO TABS: 1.0000 | ORAL_TABLET | Freq: Two times a day (BID) | ORAL | 0 refills | Status: AC

## 2024-08-07 NOTE — Patient Instructions (Signed)
 F/U with Dr Tobie as before  You are referred urgently to Podiatrist , the office should contact you  Continue to keep feet clean and dry  10 day course of antibiotic , augmentin is prescribed  TdAP in office today  Thanks for choosing [facility], we consider it a privelige to serve you.

## 2024-08-07 NOTE — Assessment & Plan Note (Signed)
 Asked:confirms currently smokes cigarettes Assess: Unwilling to set a quit date, but is cutting back Advise: needs to QUIT to reduce risk of cancer, cardio and cerebrovascular disease Assist: counseled for 5 minutes and literature provided Arrange: follow up in 2 to 4 months with PCP

## 2024-08-07 NOTE — Assessment & Plan Note (Signed)
 Augmentin prescribed and referred to Podiatry

## 2024-08-07 NOTE — Assessment & Plan Note (Signed)
 May need ENT eval will defer to PCP

## 2024-08-07 NOTE — Assessment & Plan Note (Signed)
 Controlled, no change in medication

## 2024-08-07 NOTE — Assessment & Plan Note (Signed)
 Refer Podiatry , augmentin prescribed , TDaP administered

## 2024-08-07 NOTE — Assessment & Plan Note (Signed)
 After obtaining informed consent, the TdaP vaccine is  administered , with no adverse effect noted at the time of administration.

## 2024-08-07 NOTE — Assessment & Plan Note (Signed)
 Severe  however has elevated LFT , no oral med prescribed f/u with pCP

## 2024-08-07 NOTE — Progress Notes (Signed)
" ° °  Dustin Moore     MRN: [id number]      DOB: 07-31-1961  Chief Complaint  Patient presents with   Foot Pain    Pt complains of sore on right foot since Friday, getting worse, now leaking puss. Has been using peroxide and epsom salt     HPI Dustin Moore is here with above concern 1 week h/o pain , swelling, and redness of ball of right foot under 5 th toe, no specific known inciting trauma. Has drained pus 2 days ago with some symptom relief Approximately 4 days ago while trying to scrape excess dead skin off the foot , he had skin breakdown on ball of left fifth toe with a resulting abrasion which he keeps a bandaoid on and has been using neosporin TdAP past due and agrees to get this Has h/o hoarseness , painless intermittent x 1 year, think it is worse in past several weeks with change in diuretic, no cough or difficulty breathing reported Denies symptoms of reflux or uncontrolled allergies ROS Denies recent fever or chills. Denies sinus pressure, nasal congestion, ear pain or sore throat. Denies chest congestion, productive cough or wheezing. C/o bilateral  leg swelling right worse than left   PE  BP 113/69   Pulse 87   Resp 16   Ht 5' 9.5 (1.765 m)   Wt 228 lb 0.6 oz (103.4 kg)   SpO2 97%   BMI 33.19 kg/m   Patient alert and oriented and in no cardiopulmonary distress.  HEENT: No facial asymmetry, EOMI,     Neck supple .  Chest: Clear to auscultation bilaterally.  CVS: S1, S2 no murmurs, no S3.Regular rate.  r.  Ext: No edema  MS: Adequate ROM spine, shoulders, hips and knees.  Skin: severe bilateral tinea pedis and onychomycosis. Ball of Right 5th toe tender , slightly red and hyperpigmnted circular area , wghere recent purulent drainage  occured Psych: Good eye contact, normal affect. Memory intact not anxious or depressed appearing.  CNS: CN 2-12 intact, power,  normal throughout.no focal deficits noted.   Assessment & Plan  Abrasion, left foot, initial  encounter Augmentin prescribed and referred to Podiatry  Infected abrasion of right foot Refer Podiatry , augmentin prescribed , TDaP administered  Primary hypertension Controlled, no change in medication   Onychomycosis Severe  however has elevated LFT , no oral med prescribed f/u with pCP  Hoarseness of voice May need ENT eval will defer to PCP  Immunization due After obtaining informed consent, the TdaP vaccine is  administered , with no adverse effect noted at the time of administration.   Tobacco use Asked:confirms currently smokes cigarettes Assess: Unwilling to set a quit date, but is cutting back Advise: needs to QUIT to reduce risk of cancer, cardio and cerebrovascular disease Assist: counseled for 5 minutes and literature provided Arrange: follow up in 2 to 4 months with PCP    "

## 2024-08-12 ENCOUNTER — Ambulatory Visit

## 2024-08-14 ENCOUNTER — Ambulatory Visit: Admitting: Urology

## 2024-08-14 ENCOUNTER — Encounter: Payer: Self-pay | Admitting: Urology

## 2024-08-14 VITALS — BP 116/73 | HR 80 | Ht 69.0 in | Wt 225.0 lb

## 2024-08-14 DIAGNOSIS — R3911 Hesitancy of micturition: Secondary | ICD-10-CM | POA: Diagnosis not present

## 2024-08-14 DIAGNOSIS — N35814 Other anterior urethral stricture, male: Secondary | ICD-10-CM

## 2024-08-14 DIAGNOSIS — N401 Enlarged prostate with lower urinary tract symptoms: Secondary | ICD-10-CM | POA: Diagnosis not present

## 2024-08-14 LAB — BLADDER SCAN AMB NON-IMAGING: Scan Result: 552

## 2024-08-14 NOTE — Progress Notes (Signed)
 "  08/14/2024 3:50 PM   Major Lanell 02-Jun-1961 [id number]  Referring provider: Tobie Suzzane POUR, MD [address] [place],  [place] [address]  Chief Complaint  Patient presents with   Other   Urologic history: 1.  Urethral stricture disease History of complex urethroplasty as a child Urethral dilation for recurrent stricture ~2005 Developed urinary retention 03/2024; cystoscopy under anesthesia remarkable for an 62F bulbar urethral stricture measuring 1.5 cm.  The stricture was dilated and 800 mL of urine was drained from the bladder upon introduction of the cystoscope.  Optilume balloon dilation was then performed  HPI: Dustin Moore is a 64 y.o. male who presents for follow-up visit.  At his 1 month follow-up visit he was doing well.  He states he continues to do well and voids with an excellent stream.  He has no complaints today.  PMH: Past Medical History:  Diagnosis Date   Asthma    Diabetes mellitus without complication (HCC)    type 2   Hypertension    Rotator cuff disorder, left     Surgical History: Past Surgical History:  Procedure Laterality Date   CYSTOSCOPY WITH RETROGRADE URETHROGRAM N/A 03/28/2024   Procedure: CYSTOSCOPY WITH RETROGRADE URETHROGRAM;  Surgeon: Twylla Glendia BROCKS, MD;  Location: [facility];  Service: Urology;  Laterality: N/A;   ROTATOR CUFF REPAIR Left 08/2021   TONSILLECTOMY AND ADENOIDECTOMY     TOOTH EXTRACTION N/A 12/15/2023   Procedure: Dental extractions of two, six, seven, eight, nine, eleven, thirteen, fifteen, twenty, twenty-one, twenty-two, twenty-three, twenty-four, twenty-five, twenty-six, twenty-seven, twenty-eight, twenty-nine, aveoloplasty, removal of bilateral mandibular lingual tori;  Surgeon: Sheryle Glendia, DMD;  Location: [facility];  Service: Oral Surgery;  Laterality: N/A;   TOTAL HIP ARTHROPLASTY Left 2013    Home Medications:  Allergies as of 08/14/2024       Reactions   Contrast Media [iodinated Contrast Media] Anaphylaxis         Medication List        Accurate as of August 14, 2024  3:50 PM. If you have any questions, ask your nurse or doctor.          alfuzosin  10 MG 24 hr tablet Commonly known as: UROXATRAL  TAKE 1 TABLET(10 MG) BY MOUTH DAILY WITH BREAKFAST   amoxicillin -clavulanate 875-125 MG tablet Commonly known as: AUGMENTIN  Take 1 tablet by mouth 2 (two) times daily.   aspirin EC 81 MG tablet Take 81 mg by mouth daily.   B-complex with vitamin C tablet Take 1 tablet by mouth daily.   Basaglar  KwikPen 100 UNIT/ML Inject 14 Units into the skin at bedtime.   Dexcom G7 Sensor Misc Inject 1 Application into the skin as directed. Change sensor every 10 days as directed.   DULoxetine 60 MG capsule Commonly known as: CYMBALTA TAKE 1 CAPSULE BY MOUTH DAILY   furosemide  40 MG tablet Commonly known as: LASIX  TAKE 1 TABLET(40 MG) BY MOUTH DAILY   gabapentin  300 MG capsule Commonly known as: NEURONTIN  Take 1 capsule (300 mg total) by mouth 3 (three) times daily.   insulin  lispro 100 UNIT/ML KwikPen Commonly known as: HumaLOG  KwikPen Inject 4-10 Units into the skin 3 (three) times daily.   ipratropium-albuterol  0.5-2.5 (3) MG/3ML Soln Commonly known as: DUONEB USE 1 VIAL VIA NEBULIZER EVERY 4 HOURS AS NEEDED FOR WHEEZING OR SHORTNESS OF BREATH.   losartan  50 MG tablet Commonly known as: COZAAR  Take 1 tablet (50 mg total) by mouth daily.   MAGNESIUM PO Take 500 mg by mouth  daily.   metFORMIN  500 MG tablet Commonly known as: GLUCOPHAGE  Take 1 tablet (500 mg total) by mouth 2 (two) times daily with a meal.   methadone 10 MG/5ML solution Commonly known as: DOLOPHINE Take 80 mLs by mouth daily.   omeprazole  40 MG capsule Commonly known as: PRILOSEC Take 1 capsule (40 mg total) by mouth daily.   ondansetron  4 MG tablet Commonly known as: ZOFRAN  TAKE 1 TABLET(4 MG) BY MOUTH EVERY 8 HOURS AS NEEDED FOR NAUSEA OR VOMITING   Potassium 99 MG Tabs Take 99 mg by mouth daily.    rosuvastatin  5 MG tablet Commonly known as: CRESTOR  TAKE 1 TABLET(5 MG) BY MOUTH EVERY EVENING   Symbicort  160-4.5 MCG/ACT inhaler Generic drug: budesonide -formoterol  Inhale 2 puffs into the lungs 2 (two) times daily. in the morning and at bedtime.   traZODone  50 MG tablet Commonly known as: DESYREL  Take 0.5-1 tablets (25-50 mg total) by mouth at bedtime as needed for sleep.   TRIPLE OMEGA COMPLEX PO Take 1 capsule by mouth daily.   Ventolin  HFA 108 (90 Base) MCG/ACT inhaler Generic drug: albuterol  Inhale 1-2 puffs into the lungs every 6 (six) hours as needed for wheezing or shortness of breath.        Allergies: Allergies[1]  Family History: Family History  Problem Relation Age of Onset   Diabetes Mother    Cirrhosis Mother    Heart disease Father    Heart attack Father    Heart disease Paternal Uncle     Social History:  reports that he has been smoking cigarettes. He has a 19.5 pack-year smoking history. He has never used smokeless tobacco. He reports that he does not currently use alcohol. He reports that he does not currently use drugs after having used the following drugs: Marijuana and Cocaine.   Physical Exam: BP 116/73   Pulse 80   Ht 5' 9 (1.753 m)   Wt 225 lb (102.1 kg)   BMI 33.23 kg/m   Constitutional:  Alert, No acute distress. HEENT: [place] AT Respiratory: Normal respiratory effort, no increased work of breathing. Psychiatric: Normal mood and affect.   Assessment & Plan:    1.  Urethral stricture disease Voiding well without obstructive/storage related urinary symptoms PVR today was 552 mL (>600 at initial post op visit).  This is most likely his baseline after chronic overdistention 1 year follow-up with PVR RUS for upper tract screening on follow-up Instructed to call earlier for development of obstructive voiding symptoms   Glendia JAYSON Barba, MD  [facility] [address], [address] [place], [place]  [address] [phone]     [1]  Allergies Allergen Reactions   Contrast Media [Iodinated Contrast Media] Anaphylaxis   "

## 2024-08-15 ENCOUNTER — Encounter: Payer: Self-pay | Admitting: Podiatry

## 2024-08-15 ENCOUNTER — Ambulatory Visit: Admitting: Podiatry

## 2024-08-15 ENCOUNTER — Ambulatory Visit

## 2024-08-15 DIAGNOSIS — L97512 Non-pressure chronic ulcer of other part of right foot with fat layer exposed: Secondary | ICD-10-CM | POA: Diagnosis not present

## 2024-08-15 DIAGNOSIS — I890 Lymphedema, not elsewhere classified: Secondary | ICD-10-CM

## 2024-08-15 DIAGNOSIS — I872 Venous insufficiency (chronic) (peripheral): Secondary | ICD-10-CM | POA: Diagnosis not present

## 2024-08-15 DIAGNOSIS — L97511 Non-pressure chronic ulcer of other part of right foot limited to breakdown of skin: Secondary | ICD-10-CM | POA: Diagnosis not present

## 2024-08-15 DIAGNOSIS — M2011 Hallux valgus (acquired), right foot: Secondary | ICD-10-CM

## 2024-08-15 NOTE — Progress Notes (Signed)
 HPI: Patient presents for ulcer on the plantar aspect of the right foot.  Was placed on antibiotics by his family doctor and he does not finish as he still taking them.  Has not had any fever chills nausea or vomiting.  Has been clear and has had some clear drainage from the wound.  Hence has had swelling and dry skin in the feet for a long time.   Denies N/V/F/Ch.   Objective:   Vascular:  DP pulses 2/4 bilaterally. PT pulses 2/4 bilaterally.  Capillary refill time immediate bilaterally.   Neurologic: Decreased protective sensation feet bilaterally.  Decreased vibratory sensation and sharp touch toes 1 through 5 bilaterally  Dermatologic: Full-thickness ulceration plantar fifth MTP right foot. Wound base mixed granulation and fibrinous tissue..  Moderate clear exudate.  Purulent drainage not present. Predebridement ulcer measures 6 mm x 6 mm x 2 deep.  Musculoskeletal: Hallux abductovalgus deformity bilaterally severe.  Tailor's bunion deformity bilaterally severe.  Hammertoes 2 through 5 bilaterally.  Normal muscle strength lower extremity bilaterally  Radiographs:  3 views foot right: Severe hallux valgus deformity with increased intermetatarsal angle.  Tailor's bunion deformity with increased fourth fifth metatarsal angle and lateral bowing of the fifth metatarsal.  A splayfoot deformity.  Normal bone density.  No S osteomyelitis.  Notes any fractures or dislocations.  Normal bone density.   Assessment:  Full-thickness Wagner grade 2 ulceration foot right Hallux valgus right. Lymphedema lower extremity secondary to venous insufficiency.   Plan:  -New patient office visit for evaluation and management level 3.  Modifier 25. - Discussed in the lymphedema and etiology and treatment.  Is causing part of the problem skin with some stasis dermatitis.  He may also have some tinea pedis chronic on the plantar aspect of the feet.  Will have him use AmLactin cream and Vaseline for several  weeks to see how much this improves if it resolves it he can keep doing that if not we can use some antifungals.  -Discussed with him the etiology and treatment of the ulceration.  Discussed precautionary things he needs to look out for as far as the wound for infection or worsening.SABRA  Ulcer full-thickness Wagner grade 2 plantar aspect right foot subfifth MTP. -Ulcer progress: New ulcer first time seen. -Debridement as below. -Dressed with antibiotic ointment and DSD. - Dispensed   surgical shoe right for offloading .. -Wound care: Soak twice daily luke warm epsom salt water  15 min . Apply Trabant ointment and dressing. Rx Bactroban  ointment apply twice daily to ulcer  Procedure: Excisional Debridement of Wound Tool: Sharp #312 chisel blade/tissue nipper Type of Debridement: Sharp Excisional Frequency: Every 1 to 2 weeks until appropriately healed.   Rationale: Removal of non-viable soft tissue from the wound to promote healing.  Anesthesia: none  Pre-Debridement Wound Measurements: 6 mm x 6 mm x 2 mm deep Post-Debridement Wound Measurements: 10 mm x 10 mm x 4 mm deep Area devitalized tissue removed(nonviable tissue only): 10 mm x 10 mm.  Blood loss: Minimal (<50cc) Depth of Debridement: Full-thickness into subcutaneous tissue  Description of tissue removed: Devitalized tissue Technique: Using #312 blade/tissue nipper sharp debridement of necrotic/nonviable tissue was performed until healthy bleeding wound bed at base of wound was achieved.  After debridement no nonviable was present in wound.  No underlying bone or tendon was exposed during debridement.  The wound was thoroughly irrigated with normal saline solution Dressing: Dry, sterile, compression dressing. Disposition: Patient tolerated procedure well.   Return 1 weeks  f/u ulcer right

## 2024-08-16 ENCOUNTER — Other Ambulatory Visit: Payer: Self-pay | Admitting: Podiatry

## 2024-08-16 ENCOUNTER — Telehealth: Payer: Self-pay | Admitting: Podiatry

## 2024-08-16 MED ORDER — MUPIROCIN 2 % EX OINT: 1.0000 | TOPICAL_OINTMENT | Freq: Two times a day (BID) | CUTANEOUS | 2 refills | Status: AC

## 2024-08-16 NOTE — Telephone Encounter (Signed)
 Patient's fiance called in regards to the bactroban  ointment for patient's foot. They would like the prescription sent to [facility] in [place].

## 2024-08-22 ENCOUNTER — Ambulatory Visit: Admitting: Podiatry

## 2024-08-28 ENCOUNTER — Encounter: Payer: Self-pay | Admitting: Podiatry

## 2024-08-28 ENCOUNTER — Ambulatory Visit: Admitting: Podiatry

## 2024-08-28 DIAGNOSIS — L97501 Non-pressure chronic ulcer of other part of unspecified foot limited to breakdown of skin: Secondary | ICD-10-CM | POA: Diagnosis not present

## 2024-08-28 NOTE — Progress Notes (Signed)
 Follow-up ulceration plantar lateral foot right.  No fever chills nausea or vomiting.  States has had a little bit of drainage the past few days.  Physical Exam:  Patient alert and oriented x 3.  No complaints of nausea, vomiting, fever, or chills  Vascular: DP pulses 2/4 bilateral. PT pulses 2 to/4 lateral.  Moderate edema lower extremity. Capillary fill time immediate bilaterally.  Dermatologic: Decreased Superficial ulcer plantar aspect fifth MTP right penetrating into the dermis.  Mild serous drainage.  No signs of infection.. Measures 10 mm wide x 10 mm long x 1.96mm deep.   Neurologic:   Musculoskeletal: Tailors bunion deformity right   Diagnoses: 1.  Superficial ulceration Wagner grade 1 right foot subfifth MTP  Plan: - Ulcer improved and gone from deep to superficial.  Good base granulation tissue with well-vascularized wound base. -Sharp debridement with tissue nippers superficial Wagner grade 1 ulceration subfifth MTP right foot.  Debrided any devitalized tissue with a tissue nipper.  Applied antibiotic ointment and a light dressing -Continue wound care soaking twice daily warm Epsom salt water  15 minutes, apply Bactroban  ointment, and a light dressing. - Continue surgical shoe for all weightbearing right foot   Return 2 weeks f/u ulcer RT

## 2024-09-07 ENCOUNTER — Other Ambulatory Visit: Payer: Self-pay | Admitting: Nurse Practitioner

## 2024-09-07 DIAGNOSIS — E1165 Type 2 diabetes mellitus with hyperglycemia: Secondary | ICD-10-CM

## 2024-09-07 DIAGNOSIS — Z794 Long term (current) use of insulin: Secondary | ICD-10-CM

## 2024-09-07 DIAGNOSIS — Z7985 Long-term (current) use of injectable non-insulin antidiabetic drugs: Secondary | ICD-10-CM

## 2024-09-07 DIAGNOSIS — Z7984 Long term (current) use of oral hypoglycemic drugs: Secondary | ICD-10-CM

## 2024-09-10 ENCOUNTER — Other Ambulatory Visit: Payer: Self-pay

## 2024-09-10 MED ORDER — DULOXETINE HCL 60 MG PO CPEP: 60.0000 mg | ORAL_CAPSULE | Freq: Every day | ORAL | 0 refills | Status: AC

## 2024-09-11 ENCOUNTER — Encounter: Payer: Self-pay | Admitting: Internal Medicine

## 2024-09-11 ENCOUNTER — Ambulatory Visit: Payer: MEDICAID | Admitting: Podiatry

## 2024-09-11 DIAGNOSIS — L97501 Non-pressure chronic ulcer of other part of unspecified foot limited to breakdown of skin: Secondary | ICD-10-CM | POA: Diagnosis not present

## 2024-09-11 NOTE — Progress Notes (Signed)
 Presents follow-up ulcer superficial right foot.  Still painful still doing wound care.  He says been wearing the surgical shoe that he does not have on today at office visit.  No fever chills or nausea or vomiting.SABRA  Physical Exam:  Patient alert and oriented x 3.  No complaints of nausea, vomiting, fever, or chills  Vascular: DP pulses 2/4 bilateral. PT pulses 2/4 lateral.  Moderate edema lower extremity. Capillary fill time immediate bilaterally.  Dermatologic: Superficial ulcer subfifth MTP right foot.  Penetrates into the dermis.  Good base of granulation tissue with minimal fibrinous tissue.. Measures 5 mm wide x 5 mm long x 1 mm deep.   Neurologic:   Musculoskeletal: Was bunion deformity right foot   Diagnoses: 1.  Superficial ulceration Wagner grade 1 subfifth MTP right foot   Plan: - Continue wound care soaking twice daily lukewarm Epsom salt water , apply Bactroban  ointment, and a light dressing.  Discussed with him that once we get this healed up we may need to have a do a surgical consult to correct the underlying tailor's bunion deformity.  First however we need to get the ulcer healed. -Sharp debridement with tissue nippers superficial Wagner grade 1 ulceration right foot.  Debrided any devitalized tissue with a tissue nipper.  Applied antibiotic ointment and a light dressing    Return 2 weeks follow-up ulcer right

## 2024-09-12 ENCOUNTER — Other Ambulatory Visit: Payer: Self-pay | Admitting: Internal Medicine

## 2024-09-12 DIAGNOSIS — E1140 Type 2 diabetes mellitus with diabetic neuropathy, unspecified: Secondary | ICD-10-CM

## 2024-09-12 DIAGNOSIS — E782 Mixed hyperlipidemia: Secondary | ICD-10-CM

## 2024-09-12 DIAGNOSIS — I10 Essential (primary) hypertension: Secondary | ICD-10-CM

## 2024-09-18 ENCOUNTER — Ambulatory Visit: Payer: Self-pay | Admitting: Internal Medicine

## 2024-09-26 ENCOUNTER — Ambulatory Visit: Payer: MEDICAID | Admitting: Podiatry

## 2024-10-24 ENCOUNTER — Ambulatory Visit: Admitting: Internal Medicine

## 2024-11-25 ENCOUNTER — Ambulatory Visit: Admitting: Nurse Practitioner

## 2025-08-20 ENCOUNTER — Ambulatory Visit: Admitting: Urology
